# Patient Record
Sex: Female | Born: 1975 | Race: Black or African American | Hispanic: No | Marital: Married | Smoking: Current every day smoker
Health system: Southern US, Community
[De-identification: ages and names within clinical notes are randomized; demographics above are authoritative.]

## PROBLEM LIST (undated history)

## (undated) ENCOUNTER — Inpatient Hospital Stay (HOSPITAL_COMMUNITY): Payer: Self-pay

## (undated) DIAGNOSIS — K589 Irritable bowel syndrome without diarrhea: Secondary | ICD-10-CM

## (undated) DIAGNOSIS — D649 Anemia, unspecified: Secondary | ICD-10-CM

## (undated) DIAGNOSIS — I10 Essential (primary) hypertension: Secondary | ICD-10-CM

## (undated) DIAGNOSIS — E0590 Thyrotoxicosis, unspecified without thyrotoxic crisis or storm: Secondary | ICD-10-CM

## (undated) HISTORY — PX: TONSILLECTOMY: SUR1361

## (undated) HISTORY — PX: TUBAL LIGATION: SHX77

---

## 1997-12-01 ENCOUNTER — Inpatient Hospital Stay (HOSPITAL_COMMUNITY): Admission: AD | Admit: 1997-12-01 | Discharge: 1997-12-01 | Payer: Self-pay | Admitting: Obstetrics

## 1998-02-28 ENCOUNTER — Emergency Department (HOSPITAL_COMMUNITY): Admission: EM | Admit: 1998-02-28 | Discharge: 1998-02-28 | Payer: Self-pay | Admitting: Emergency Medicine

## 1998-05-31 ENCOUNTER — Emergency Department (HOSPITAL_COMMUNITY): Admission: EM | Admit: 1998-05-31 | Discharge: 1998-05-31 | Payer: Self-pay

## 1999-01-02 ENCOUNTER — Emergency Department (HOSPITAL_COMMUNITY): Admission: EM | Admit: 1999-01-02 | Discharge: 1999-01-02 | Payer: Self-pay | Admitting: Emergency Medicine

## 1999-03-10 ENCOUNTER — Inpatient Hospital Stay (HOSPITAL_COMMUNITY): Admission: AD | Admit: 1999-03-10 | Discharge: 1999-03-10 | Payer: Self-pay | Admitting: *Deleted

## 1999-03-16 ENCOUNTER — Ambulatory Visit (HOSPITAL_COMMUNITY): Admission: RE | Admit: 1999-03-16 | Discharge: 1999-03-16 | Payer: Self-pay | Admitting: *Deleted

## 1999-05-23 ENCOUNTER — Emergency Department (HOSPITAL_COMMUNITY): Admission: EM | Admit: 1999-05-23 | Discharge: 1999-05-23 | Payer: Self-pay | Admitting: *Deleted

## 1999-08-29 ENCOUNTER — Inpatient Hospital Stay (HOSPITAL_COMMUNITY): Admission: AD | Admit: 1999-08-29 | Discharge: 1999-08-29 | Payer: Self-pay | Admitting: Obstetrics

## 1999-09-18 ENCOUNTER — Inpatient Hospital Stay (HOSPITAL_COMMUNITY): Admission: AD | Admit: 1999-09-18 | Discharge: 1999-09-18 | Payer: Self-pay | Admitting: Obstetrics

## 1999-09-21 ENCOUNTER — Encounter (HOSPITAL_COMMUNITY): Admission: AD | Admit: 1999-09-21 | Discharge: 1999-09-22 | Payer: Self-pay | Admitting: Obstetrics

## 1999-09-22 ENCOUNTER — Inpatient Hospital Stay (HOSPITAL_COMMUNITY): Admission: AD | Admit: 1999-09-22 | Discharge: 1999-09-25 | Payer: Self-pay | Admitting: Obstetrics

## 1999-09-22 ENCOUNTER — Encounter (INDEPENDENT_AMBULATORY_CARE_PROVIDER_SITE_OTHER): Payer: Self-pay

## 1999-10-09 ENCOUNTER — Inpatient Hospital Stay (HOSPITAL_COMMUNITY): Admission: AD | Admit: 1999-10-09 | Discharge: 1999-10-09 | Payer: Self-pay | Admitting: *Deleted

## 1999-12-29 ENCOUNTER — Emergency Department (HOSPITAL_COMMUNITY): Admission: EM | Admit: 1999-12-29 | Discharge: 1999-12-29 | Payer: Self-pay | Admitting: Emergency Medicine

## 2000-05-26 ENCOUNTER — Inpatient Hospital Stay (HOSPITAL_COMMUNITY): Admission: AD | Admit: 2000-05-26 | Discharge: 2000-05-26 | Payer: Self-pay | Admitting: Obstetrics

## 2000-12-28 ENCOUNTER — Inpatient Hospital Stay (HOSPITAL_COMMUNITY): Admission: AD | Admit: 2000-12-28 | Discharge: 2000-12-28 | Payer: Self-pay | Admitting: *Deleted

## 2002-11-12 ENCOUNTER — Inpatient Hospital Stay (HOSPITAL_COMMUNITY): Admission: AD | Admit: 2002-11-12 | Discharge: 2002-11-12 | Payer: Self-pay | Admitting: Family Medicine

## 2003-05-19 ENCOUNTER — Emergency Department (HOSPITAL_COMMUNITY): Admission: EM | Admit: 2003-05-19 | Discharge: 2003-05-19 | Payer: Self-pay | Admitting: Emergency Medicine

## 2003-06-15 ENCOUNTER — Emergency Department (HOSPITAL_COMMUNITY): Admission: EM | Admit: 2003-06-15 | Discharge: 2003-06-15 | Payer: Self-pay | Admitting: Emergency Medicine

## 2003-07-23 ENCOUNTER — Emergency Department (HOSPITAL_COMMUNITY): Admission: EM | Admit: 2003-07-23 | Discharge: 2003-07-23 | Payer: Self-pay | Admitting: Emergency Medicine

## 2003-08-10 ENCOUNTER — Encounter: Admission: RE | Admit: 2003-08-10 | Discharge: 2003-08-10 | Payer: Self-pay | Admitting: Family Medicine

## 2003-08-20 ENCOUNTER — Emergency Department (HOSPITAL_COMMUNITY): Admission: EM | Admit: 2003-08-20 | Discharge: 2003-08-20 | Payer: Self-pay | Admitting: Emergency Medicine

## 2004-02-09 ENCOUNTER — Other Ambulatory Visit: Admission: RE | Admit: 2004-02-09 | Discharge: 2004-02-09 | Payer: Self-pay | Admitting: Family Medicine

## 2004-05-03 ENCOUNTER — Ambulatory Visit: Payer: Self-pay | Admitting: Family Medicine

## 2004-07-25 ENCOUNTER — Ambulatory Visit: Payer: Self-pay | Admitting: Family Medicine

## 2004-08-29 ENCOUNTER — Ambulatory Visit: Payer: Self-pay | Admitting: Family Medicine

## 2004-10-11 ENCOUNTER — Ambulatory Visit: Payer: Self-pay | Admitting: Family Medicine

## 2004-11-04 ENCOUNTER — Emergency Department (HOSPITAL_COMMUNITY): Admission: EM | Admit: 2004-11-04 | Discharge: 2004-11-05 | Payer: Self-pay | Admitting: Emergency Medicine

## 2005-01-04 ENCOUNTER — Ambulatory Visit: Payer: Self-pay | Admitting: Family Medicine

## 2005-03-22 ENCOUNTER — Ambulatory Visit: Payer: Self-pay | Admitting: Family Medicine

## 2005-03-29 ENCOUNTER — Other Ambulatory Visit: Admission: RE | Admit: 2005-03-29 | Discharge: 2005-03-29 | Payer: Self-pay | Admitting: Family Medicine

## 2005-03-29 ENCOUNTER — Encounter: Payer: Self-pay | Admitting: Family Medicine

## 2005-03-29 ENCOUNTER — Ambulatory Visit: Payer: Self-pay | Admitting: Family Medicine

## 2005-06-21 ENCOUNTER — Ambulatory Visit: Payer: Self-pay | Admitting: Family Medicine

## 2005-08-30 ENCOUNTER — Emergency Department (HOSPITAL_COMMUNITY): Admission: EM | Admit: 2005-08-30 | Discharge: 2005-08-30 | Payer: Self-pay | Admitting: Emergency Medicine

## 2005-09-12 ENCOUNTER — Ambulatory Visit: Payer: Self-pay | Admitting: Family Medicine

## 2005-11-30 ENCOUNTER — Ambulatory Visit: Payer: Self-pay | Admitting: Family Medicine

## 2006-01-17 ENCOUNTER — Emergency Department (HOSPITAL_COMMUNITY): Admission: EM | Admit: 2006-01-17 | Discharge: 2006-01-17 | Payer: Self-pay | Admitting: Emergency Medicine

## 2006-02-25 ENCOUNTER — Ambulatory Visit: Payer: Self-pay | Admitting: Family Medicine

## 2006-03-29 ENCOUNTER — Emergency Department (HOSPITAL_COMMUNITY): Admission: EM | Admit: 2006-03-29 | Discharge: 2006-03-29 | Payer: Self-pay | Admitting: Emergency Medicine

## 2006-06-10 ENCOUNTER — Ambulatory Visit: Payer: Self-pay | Admitting: Family Medicine

## 2006-06-11 ENCOUNTER — Ambulatory Visit: Payer: Self-pay | Admitting: Family Medicine

## 2006-07-06 ENCOUNTER — Emergency Department (HOSPITAL_COMMUNITY): Admission: EM | Admit: 2006-07-06 | Discharge: 2006-07-07 | Payer: Self-pay | Admitting: Emergency Medicine

## 2006-07-11 ENCOUNTER — Encounter (INDEPENDENT_AMBULATORY_CARE_PROVIDER_SITE_OTHER): Payer: Self-pay | Admitting: Family Medicine

## 2006-07-11 ENCOUNTER — Ambulatory Visit: Payer: Self-pay | Admitting: Family Medicine

## 2006-08-06 ENCOUNTER — Ambulatory Visit: Payer: Self-pay | Admitting: Family Medicine

## 2006-08-08 ENCOUNTER — Emergency Department (HOSPITAL_COMMUNITY): Admission: EM | Admit: 2006-08-08 | Discharge: 2006-08-08 | Payer: Self-pay | Admitting: Emergency Medicine

## 2006-10-03 ENCOUNTER — Ambulatory Visit: Payer: Self-pay | Admitting: Family Medicine

## 2007-02-26 ENCOUNTER — Emergency Department (HOSPITAL_COMMUNITY): Admission: EM | Admit: 2007-02-26 | Discharge: 2007-02-26 | Payer: Self-pay | Admitting: Emergency Medicine

## 2008-03-05 ENCOUNTER — Emergency Department (HOSPITAL_COMMUNITY): Admission: EM | Admit: 2008-03-05 | Discharge: 2008-03-05 | Payer: Self-pay | Admitting: Emergency Medicine

## 2008-06-27 ENCOUNTER — Emergency Department (HOSPITAL_COMMUNITY): Admission: EM | Admit: 2008-06-27 | Discharge: 2008-06-28 | Payer: Self-pay | Admitting: Emergency Medicine

## 2008-07-08 ENCOUNTER — Ambulatory Visit: Payer: Self-pay | Admitting: Family Medicine

## 2008-07-08 DIAGNOSIS — N760 Acute vaginitis: Secondary | ICD-10-CM

## 2008-07-27 ENCOUNTER — Encounter: Admission: RE | Admit: 2008-07-27 | Discharge: 2008-07-27 | Payer: Self-pay | Admitting: Obstetrics

## 2009-05-04 ENCOUNTER — Emergency Department (HOSPITAL_COMMUNITY): Admission: EM | Admit: 2009-05-04 | Discharge: 2009-05-04 | Payer: Self-pay | Admitting: Emergency Medicine

## 2009-10-18 ENCOUNTER — Ambulatory Visit: Payer: Self-pay | Admitting: Family Medicine

## 2009-10-25 ENCOUNTER — Ambulatory Visit: Payer: Self-pay | Admitting: Family Medicine

## 2009-10-25 ENCOUNTER — Other Ambulatory Visit: Admission: RE | Admit: 2009-10-25 | Discharge: 2009-10-25 | Payer: Self-pay | Admitting: Family Medicine

## 2009-10-25 LAB — CONVERTED CEMR LAB
ALT: 13 units/L (ref 0–35)
Albumin: 3.4 g/dL — ABNORMAL LOW (ref 3.5–5.2)
Alkaline Phosphatase: 70 units/L (ref 39–117)
Basophils Absolute: 0 10*3/uL (ref 0.0–0.1)
Basophils Relative: 0.5 % (ref 0.0–3.0)
Bilirubin Urine: NEGATIVE
Bilirubin, Direct: 0.1 mg/dL (ref 0.0–0.3)
Creatinine, Ser: 0.8 mg/dL (ref 0.4–1.2)
Eosinophils Relative: 2.2 % (ref 0.0–5.0)
HCT: 34.1 % — ABNORMAL LOW (ref 36.0–46.0)
Hemoglobin: 11.4 g/dL — ABNORMAL LOW (ref 12.0–15.0)
Ketones, urine, test strip: NEGATIVE
LDL Cholesterol: 37 mg/dL (ref 0–99)
Monocytes Absolute: 0.6 10*3/uL (ref 0.1–1.0)
Monocytes Relative: 8.8 % (ref 3.0–12.0)
Neutro Abs: 4 10*3/uL (ref 1.4–7.7)
Nitrite: NEGATIVE
Potassium: 3.9 meq/L (ref 3.5–5.1)
RBC: 4 M/uL (ref 3.87–5.11)
Sodium: 146 meq/L — ABNORMAL HIGH (ref 135–145)
Specific Gravity, Urine: 1.02
TSH: 1.09 microintl units/mL (ref 0.35–5.50)
Total Bilirubin: 0.5 mg/dL (ref 0.3–1.2)
Total CHOL/HDL Ratio: 2
Total Protein: 7.4 g/dL (ref 6.0–8.3)
Triglycerides: 78 mg/dL (ref 0.0–149.0)
VLDL: 15.6 mg/dL (ref 0.0–40.0)
WBC Urine, dipstick: NEGATIVE
WBC: 6.6 10*3/uL (ref 4.5–10.5)

## 2009-12-25 ENCOUNTER — Emergency Department (HOSPITAL_COMMUNITY): Admission: EM | Admit: 2009-12-25 | Discharge: 2009-12-25 | Payer: Self-pay | Admitting: Emergency Medicine

## 2010-01-09 ENCOUNTER — Ambulatory Visit: Payer: Self-pay | Admitting: Family Medicine

## 2010-01-09 DIAGNOSIS — S61209A Unspecified open wound of unspecified finger without damage to nail, initial encounter: Secondary | ICD-10-CM

## 2010-01-24 ENCOUNTER — Ambulatory Visit: Payer: Self-pay | Admitting: Family Medicine

## 2010-08-01 NOTE — Assessment & Plan Note (Signed)
Summary: suture removal/dm   Vital Signs:  Patient profile:   35 year old female Weight:      207 pounds BP sitting:   120 / 80  (left arm) Cuff size:   large  Vitals Entered By: Raechel Ache, RN (January 09, 2010 4:04 PM) CC: Needs sutures out R thumb. Went to [facility] ER 6/26, DT given.   History of Present Illness: On 12-25-09 while washing dishes at home, a glass broke and she got a laceration on the right thumb. She went to the ER, and had 8 sutures placed. She had a tD then also. She has been applying Neosporin to it daily. It is still a little sore, but she has been using it normally.   Allergies: 1)  ! Adult Aspirin Low Strength (Aspirin)  Past History:  Past Medical History: Reviewed history from 07/08/2008 and no changes required. Unremarkable  Past Surgical History: Reviewed history from 07/08/2008 and no changes required. Caesarean section  Review of Systems  The patient denies anorexia, fever, weight loss, weight gain, vision loss, decreased hearing, hoarseness, chest pain, syncope, dyspnea on exertion, peripheral edema, prolonged cough, headaches, hemoptysis, abdominal pain, melena, hematochezia, severe indigestion/heartburn, hematuria, incontinence, genital sores, muscle weakness, suspicious skin lesions, transient blindness, difficulty walking, depression, unusual weight change, abnormal bleeding, enlarged lymph nodes, angioedema, breast masses, and testicular masses.    Physical Exam  General:  Well-developed,well-nourished,in no acute distress; alert,appropriate and cooperative throughout examination Skin:  the extensor surface of the right thumb MCP area has a healing laceration. The wound is clean.    Impression & Recommendations:  Problem # 1:  LACERATION, FINGER (ICD-883.0)  Orders: Suture Removal by Non-Operative MD ([id number])  Complete Medication List: 1)  Ferrous Sulfate 300 (60 Fe) Mg/56ml Syrp (Ferrous sulfate) .Marland Kitchen.. 1 once daily as needed  Patient  Instructions: 1)  All sutures were removed.  2)  Please schedule a follow-up appointment as needed .    Immunization History:  Tetanus/Td Immunization History:    Tetanus/Td:  td (12/25/2009)

## 2010-08-01 NOTE — Assessment & Plan Note (Signed)
Summary: depo inj/njr  Nurse Visit   Allergies: 1)  ! Adult Aspirin Low Strength (Aspirin) Laboratory Results   Urine Tests  Date/Time Received: January 24, 2010 3:04 PM  Date/Time Reported: January 24, 2010 3:04 PM     Urine HCG: negative Comments: Wynona Canes, CMA  January 24, 2010 3:04 PM      Medication Administration  Injection # 1:    Medication: Depo-Provera 150mg     Diagnosis: CONTRACEPTIVE MANAGEMENT (ICD-V25.09)    Route: IM    Site: L deltoid    Exp Date: 03/14    Lot #: [id number]    Mfr: [email or web address]    Patient tolerated injection without complications    Given by: Raechel Ache, RN (January 24, 2010 3:25 PM)  Orders Added: 1)  Urine Pregnancy Test  [81025] 2)  Depo-Provera 150mg  [J1055] 3)  Admin of Therapeutic Inj  intramuscular or subcutaneous [96372]  Laboratory Results   Urine Tests      Urine HCG: negative Comments: Wynona Canes, CMA  January 24, 2010 3:04 PM

## 2010-08-01 NOTE — Assessment & Plan Note (Signed)
Summary: cpx/pap//njr   Vital Signs:  Patient profile:   35 year old female Height:      63.75 inches Weight:      205 pounds BMI:     35.59 BP sitting:   126 / 78  (left arm) Cuff size:   regular  Vitals Entered By: Raechel Ache, RN (October 25, 2009 2:48 PM) CC: CPX, labs done. LMP 10/19/08, wants to talk about depo shots.   History of Present Illness: 35 yr old female for a cpx. She feels fine and has no concerns. She would like to get back on Depoprovera shots however. She took them for years and did well, until she stopped about 2 years ago. her menses are regular.   Preventive Screening-Counseling & Management  Alcohol-Tobacco     Smoking Status: current  Allergies: 1)  ! Adult Aspirin Low Strength (Aspirin)  Past History:  Past Medical History: Reviewed history from 07/08/2008 and no changes required. Unremarkable  Past Surgical History: Reviewed history from 07/08/2008 and no changes required. Caesarean section  Family History: Reviewed history and no changes required. Family History Diabetes 1st degree relative  Social History: Reviewed history and no changes required. Married Current Smoker Alcohol use-yes Smoking Status:  current  Review of Systems  The patient denies anorexia, fever, weight loss, weight gain, vision loss, decreased hearing, hoarseness, chest pain, syncope, dyspnea on exertion, peripheral edema, prolonged cough, headaches, hemoptysis, abdominal pain, melena, hematochezia, severe indigestion/heartburn, hematuria, incontinence, genital sores, muscle weakness, suspicious skin lesions, transient blindness, difficulty walking, depression, unusual weight change, abnormal bleeding, enlarged lymph nodes, angioedema, breast masses, and testicular masses.    Physical Exam  General:  overweight-appearing.   Head:  Normocephalic and atraumatic without obvious abnormalities. No apparent alopecia or balding. Eyes:  No corneal or conjunctival  inflammation noted. EOMI. Perrla. Funduscopic exam benign, without hemorrhages, exudates or papilledema. Vision grossly normal. Ears:  External ear exam shows no significant lesions or deformities.  Otoscopic examination reveals clear canals, tympanic membranes are intact bilaterally without bulging, retraction, inflammation or discharge. Hearing is grossly normal bilaterally. Nose:  External nasal examination shows no deformity or inflammation. Nasal mucosa are pink and moist without lesions or exudates. Mouth:  Oral mucosa and oropharynx without lesions or exudates.  Teeth in good repair. Neck:  No deformities, masses, or tenderness noted. Chest Wall:  No deformities, masses, or tenderness noted. Breasts:  No mass, nodules, thickening, tenderness, bulging, retraction, inflamation, nipple discharge or skin changes noted.   Lungs:  Normal respiratory effort, chest expands symmetrically. Lungs are clear to auscultation, no crackles or wheezes. Heart:  Normal rate and regular rhythm. S1 and S2 normal without gallop, murmur, click, rub or other extra sounds. Abdomen:  Bowel sounds positive,abdomen soft and non-tender without masses, organomegaly or hernias noted. Genitalia:  Pelvic Exam:        External: normal female genitalia without lesions or masses        Vagina: normal without lesions or masses        Cervix: normal without lesions or masses        Adnexa: normal bimanual exam without masses or fullness        Uterus: normal by palpation        Pap smear: performed Msk:  No deformity or scoliosis noted of thoracic or lumbar spine.   Pulses:  R and L carotid,radial,femoral,dorsalis pedis and posterior tibial pulses are full and equal bilaterally Extremities:  No clubbing, cyanosis, edema, or deformity noted with  normal full range of motion of all joints.   Neurologic:  No cranial nerve deficits noted. Station and gait are normal. Plantar reflexes are down-going bilaterally. DTRs are symmetrical  throughout. Sensory, motor and coordinative functions appear intact. Skin:  Intact without suspicious lesions or rashes Cervical Nodes:  No lymphadenopathy noted Axillary Nodes:  No palpable lymphadenopathy Inguinal Nodes:  No significant adenopathy Psych:  Cognition and judgment appear intact. Alert and cooperative with normal attention span and concentration. No apparent delusions, illusions, hallucinations   Impression & Recommendations:  Problem # 1:  HEALTH MAINTENANCE EXAM (ICD-V70.0)  Orders: Urine Pregnancy Test  (30865)  Complete Medication List: 1)  Ferrous Sulfate 300 (60 Fe) Mg/3ml Syrp (Ferrous sulfate) .Marland Kitchen.. 1 once daily as needed  Other Orders: Depo-Provera 150mg  (H8469) Admin of Therapeutic Inj  intramuscular or subcutaneous (62952)  Patient Instructions: 1)  given a Depoprovera shot today. 2)  It is important that you exercise reguarly at least 20 minutes 5 times a week. If you develop chest pain, have severe difficulty breathing, or feel very tired, stop exercising immediately and seek medical attention.  3)  You need to lose weight. Consider a lower calorie diet and regular exercise.   Laboratory Results   Urine Tests  Date/Time Recieved: October 25, 2009 3:52 PM  Date/Time Reported: October 25, 2009 3:52 PM     Urine HCG: negative Comments: Wynona Canes, CMA  October 25, 2009 3:52 PM      Medication Administration  Injection # 1:    Medication: Depo-Provera 150mg     Diagnosis: CONTRACEPTIVE MANAGEMENT (ICD-V25.09)    Route: IM    Site: L deltoid    Exp Date: 05/13    Lot #: [id number]    Mfr: [email or web address]    Patient tolerated injection without complications    Given by: Raechel Ache, RN (October 25, 2009 3:59 PM)  Orders Added: 1)  Est. Patient 18-39 years [99395] 2)  Urine Pregnancy Test  [81025] 3)  Depo-Provera 150mg  [J1055] 4)  Admin of Therapeutic Inj  intramuscular or subcutaneous [40102]

## 2010-09-03 ENCOUNTER — Emergency Department (HOSPITAL_COMMUNITY)
Admission: EM | Admit: 2010-09-03 | Discharge: 2010-09-03 | Disposition: A | Discharge status: Home / Self Care | Payer: Worker's Compensation | Attending: Emergency Medicine | Admitting: Emergency Medicine

## 2010-09-03 ENCOUNTER — Emergency Department (HOSPITAL_COMMUNITY): Payer: Worker's Compensation

## 2010-09-03 DIAGNOSIS — M25539 Pain in unspecified wrist: Secondary | ICD-10-CM | POA: Insufficient documentation

## 2010-09-03 DIAGNOSIS — IMO0002 Reserved for concepts with insufficient information to code with codable children: Secondary | ICD-10-CM | POA: Insufficient documentation

## 2010-11-17 NOTE — Op Note (Signed)
[facility]  Patient:    Shelby Harris, Shelby Harris                      MRN: [id number] Proc. Date: 09/23/99 Adm. Date:  [id number] Disc. Date: [id number] Attending:  Venita Sheffield                           Operative Report  PREOPERATIVE DIAGNOSIS:       Failure to progress in labor.  POSTOPERATIVE DIAGNOSIS:  OPERATION:  SURGEON:                      Kathreen Cosier, M.D.  ANESTHESIA:                   Epidural.  DESCRIPTION OF PROCEDURE:     Patient was placed on the operating table in supine position, abdomen prepped and draped, bladder emptied with a Foley catheter. Transverse suprapubic incision was made and carried down to the rectus fascia, fascia cleaned and incised the length of the incision, rectus muscles retracted  laterally, peritoneum incised longitudinally.  Transverse incision was made in he visceroperitoneum above the bladder and the bladder mobilized inferiorly. Transverse lower uterine incision was made; the fluid was meconium-stained. The patient was delivered from the OP position of a female, Apgar 9/9, weighing 7 pounds. The nasopharynx and stomach were DeLeeed prior to delivery of the shoulders. The team was in attendance.  The placenta was anterior, removed manually and sent to pathology.  Uterine cavity was cleaned with dry laps.  The uterine incision was  closed with one stitch of continuous interlocking suture of #1 chromic; hemostasis was satisfactory.  Bladder flap was reattached with 2-0 chromic.  Uterus was well-contracted.  Tubes and ovaries were normal.  Abdomen was closed in layers - peritoneum with continuous suture of 0 chromic, fascia with continuous suture of 0 Dexon and the skin closed with a subcuticular suture of 3-0 plain.  Patient tolerated procedure well and taken to recovery room in good condition.DD: 09/23/99 TD:  09/25/99 Job: [id number]

## 2010-11-17 NOTE — Discharge Summary (Signed)
[facility]  Patient:    Shelby Harris, Shelby Harris                      MRN: [id number] Adm. Date:  [id number] Disc. Date: [id number] Attending:  Venita Sheffield                           Discharge Summary  HISTORY OF PRESENT ILLNESS:   The patient is a 35 year old primigravida, [facility] September 20, 1999, who was admitted in labor.  The cervix was 4 cm, 100%, with vertex -2.  HOSPITAL COURSE:              On admission, amniotomy was performed and the fluid was meconium stained.  IUPC and scalp lead were applied.  Her diastolic blood pressures also ranged between 90 and 105.  Urine protein was negative.  PIH labs were negative except for uric acid of 7.1.  She was started on magnesium sulfate 4 gram load and 2 grams per hour.  The patient was started on Pitocin for hypotonic dysfunction and she progressed to 7 cm.  At 7 p.m. on March 23, she was 8 cm with a vertex molded to -1 station and by 12:45 a.m. on March 24, there were no changes in the pelvic findings at that time with adequate labor.  It was decided that she would be delivered by cesarean section for failure to progress.  She underwent  low transverse cesarean section delivering a 7 pound female, Apgars 9 and 9, from the OP position on March 24.  The fluid was meconium stained.  Cord pH was also done. Postoperatively, the patient did well.  She was discharged home on the third postoperative day.  Her hemoglobin on admission was 9.9 and postoperative 8.8. Her repeat PIH labs were all normal and her blood pressure was normal and the magnesium sulfate was discontinued the day after her cesarean section.  She was discharged home on Tylox one q.4h. p.r.n. to see me in six weeks.  DISCHARGE DIAGNOSES:          1. Status post preeclampsia, mild.                               2. Status post cesarean section for failure to                                  progress in labor. DD:  09/25/99 TD:   09/25/99 Job: [id number]

## 2010-11-17 NOTE — H&P (Signed)
[facility]  Patient:    Shelby Harris, Shelby Harris                      MRN: [id number] Adm. Date:  [id number] Attending:  Venita Sheffield                         History and Physical  HISTORY OF PRESENT ILLNESS:   Patient is a 34 year old primigravida, [facility] September 20, 1999, who was in admitted in labor, 4 cm, 100%, with a vertex -2. Amniotomy was performed and the fluid was meconium stained.  Her diastolic was lso between 90 and 105 and her urine protein was negative.  Uric acid 7.1.  She was  started on magnesium sulfate 4 g loading, then 2 g/hr.  During the course of labor, her blood pressures remained normal.  Patient received Pitocin stimulation during the day because of hypotonic dysfunction, for a total of up to 4 mU/min of Pitocin; however, this had to be stopped and restarted from time to time because of hyperstimulation.  At 7 p.m. on September 22, 1999, she was 8 cm and molded to a -1  station, contracting every two to three minutes.  At 12:45 a.m., on September 23, 1999, the pelvic findings were unchanged except for their being more nd more latent.  It was decided she would be delivered by a C-section because of failure to progress in labor.  PHYSICAL EXAMINATION:  GENERAL:                      Physical exam revealed a well-developed female in  labor.  HEENT:                        Negative.  LUNGS:                        Clear.  HEART:                        Regular rhythm.  No murmurs.  No gallops.  ABDOMEN:                      Term-size uterus.  Fetal heart 140.  PELVIC:                       As described above.  EXTREMITIES:                  Edema 2+. DD:  09/23/99 TD:  09/23/99 Job: [id number]

## 2011-03-10 ENCOUNTER — Inpatient Hospital Stay (HOSPITAL_COMMUNITY): Payer: Medicaid Other

## 2011-03-10 ENCOUNTER — Encounter (HOSPITAL_COMMUNITY): Payer: Self-pay

## 2011-03-10 ENCOUNTER — Inpatient Hospital Stay (HOSPITAL_COMMUNITY)
Admission: AD | Admit: 2011-03-10 | Discharge: 2011-03-10 | Disposition: A | Discharge status: Home / Self Care | Payer: Medicaid Other | Source: Ambulatory Visit | Attending: Obstetrics | Admitting: Obstetrics

## 2011-03-10 DIAGNOSIS — R109 Unspecified abdominal pain: Secondary | ICD-10-CM | POA: Insufficient documentation

## 2011-03-10 DIAGNOSIS — A499 Bacterial infection, unspecified: Secondary | ICD-10-CM | POA: Insufficient documentation

## 2011-03-10 DIAGNOSIS — B9689 Other specified bacterial agents as the cause of diseases classified elsewhere: Secondary | ICD-10-CM | POA: Insufficient documentation

## 2011-03-10 DIAGNOSIS — N760 Acute vaginitis: Secondary | ICD-10-CM | POA: Insufficient documentation

## 2011-03-10 DIAGNOSIS — O2390 Unspecified genitourinary tract infection in pregnancy, unspecified trimester: Secondary | ICD-10-CM | POA: Insufficient documentation

## 2011-03-10 DIAGNOSIS — Z3490 Encounter for supervision of normal pregnancy, unspecified, unspecified trimester: Secondary | ICD-10-CM

## 2011-03-10 HISTORY — DX: Irritable bowel syndrome, unspecified: K58.9

## 2011-03-10 LAB — ABO/RH: ABO/RH(D): O POS

## 2011-03-10 LAB — CBC
MCH: 27.3 pg (ref 26.0–34.0)
MCHC: 32.7 g/dL (ref 30.0–36.0)
MCV: 83.5 fL (ref 78.0–100.0)
Platelets: 369 10*3/uL (ref 150–400)
RBC: 3.99 MIL/uL (ref 3.87–5.11)
RDW: 13.5 % (ref 11.5–15.5)
WBC: 8.2 10*3/uL (ref 4.0–10.5)

## 2011-03-10 LAB — URINALYSIS, ROUTINE W REFLEX MICROSCOPIC
Bilirubin Urine: NEGATIVE
Ketones, ur: NEGATIVE mg/dL
Leukocytes, UA: NEGATIVE
Nitrite: NEGATIVE
Urobilinogen, UA: 1 mg/dL (ref 0.0–1.0)

## 2011-03-10 LAB — POCT PREGNANCY, URINE: Preg Test, Ur: POSITIVE

## 2011-03-10 LAB — URINE MICROSCOPIC-ADD ON

## 2011-03-10 LAB — WET PREP, GENITAL: WBC, Wet Prep HPF POC: NONE SEEN

## 2011-03-10 LAB — HCG, QUANTITATIVE, PREGNANCY: hCG, Beta Chain, Quant, S: 67302 m[IU]/mL — ABNORMAL HIGH (ref <5)

## 2011-03-10 MED ORDER — PRENATAL VITAMINS (DIS) PO TABS: 1.0000 | ORAL_TABLET | Freq: Every day | ORAL | Status: DC

## 2011-03-10 MED ORDER — METRONIDAZOLE 500 MG PO TABS: 500.0000 mg | ORAL_TABLET | Freq: Two times a day (BID) | ORAL | Status: AC

## 2011-03-10 NOTE — Progress Notes (Signed)
G1P1. LMP 7/5/512. May be pregnant. Abd cramping since yesterday. No bleeding or d/c

## 2011-03-10 NOTE — ED Provider Notes (Signed)
Chief Complaint:  Abdominal Pain   Shelby Harris is  35 y.o.  G2P1001 [redacted]w[redacted]d Patient's last menstrual period was 01/04/2011.Marland Kitchen  Her pregnancy status is positive.  She presents complaining of Abdominal Pain . Onset is described as gradual and has been present for  2 days. Upper and lower abd pain. States + UPT x 2 at home yesterday. Denies dysuria, d/c, and vag bleeding  Obstetrical/Gynecological History: OB History    Grav Para Term Preterm Abortions TAB SAB Ect Mult Living   2 1 1  0 0 0 0 0 0 1      Past Medical History: Past Medical History  Diagnosis Date  . IBS (irritable bowel syndrome)     Past Surgical History: Past Surgical History  Procedure Date  . Cesarean section     Family History: No family history on file.  Social History: History  Substance Use Topics  . Smoking status: Current Everyday Smoker  . Smokeless tobacco: Not on file  . Alcohol Use: No    Allergies:  Allergies  Allergen Reactions  . Aspirin Other (See Comments)    REACTION: since childhood--does not remember    Prescriptions prior to admission  Medication Sig Dispense Refill  . FOLIC ACID PO Take 1 tablet by mouth daily.        Marland Kitchen ibuprofen (ADVIL,MOTRIN) 200 MG tablet Take 800 mg by mouth daily as needed. For pain        . Multiple Vitamins-Minerals (MULTI COMPLETE PO) Take 1 tablet by mouth daily.        . polyethylene glycol powder (MIRALAX) powder Take 17 g by mouth daily.        . ferrous sulfate 300 (60 FE) MG/5ML syrup Take 300 mg by mouth daily.          Review of Systems - History obtained from the patient General ROS: negative Gastrointestinal ROS: positive for - abdominal pain, constipation and gas/bloating Genito-Urinary ROS: no dysuria, trouble voiding, or hematuria  Physical Exam   Blood pressure 129/79, pulse 98, temperature 98.5 F (36.9 C), temperature source Oral, resp. rate 20, height 5\' 4"  (1.626 m), weight 91.445 kg (201 lb 9.6 oz), last menstrual period  01/04/2011.  General: General appearance - alert, well appearing, and in no distress and overweight Mental status - alert, oriented to person, place, and time, normal mood, behavior, speech, dress, motor activity, and thought processes, affect appropriate to mood Abdomen - soft, nontender, nondistended, no masses or organomegaly, obese Focused Gynecological Exam: VULVA: normal appearing vulva with no masses, tenderness or lesions, VAGINA: vaginal discharge - white and malodorous, CERVIX: normal appearing cervix without discharge or lesions, UTERUS: enlarged to 8 week's size, ADNEXA: normal adnexa in size, nontender and no masses  Labs: Recent Results (from the past 24 hour(s))  URINALYSIS, ROUTINE W REFLEX MICROSCOPIC   Collection Time   03/10/11  7:22 PM      Component Value Range   Color, Urine YELLOW  YELLOW    Appearance CLEAR  CLEAR    Specific Gravity, Urine 1.010  1.005 - 1.030    pH 6.0  5.0 - 8.0    Glucose, UA NEGATIVE  NEGATIVE (mg/dL)   Hgb urine dipstick TRACE (*) NEGATIVE    Bilirubin Urine NEGATIVE  NEGATIVE    Ketones, ur NEGATIVE  NEGATIVE (mg/dL)   Protein, ur NEGATIVE  NEGATIVE (mg/dL)   Urobilinogen, UA 1.0  0.0 - 1.0 (mg/dL)   Nitrite NEGATIVE  NEGATIVE    Leukocytes, UA  NEGATIVE  NEGATIVE   URINE MICROSCOPIC-ADD ON   Collection Time   03/10/11  7:22 PM      Component Value Range   Squamous Epithelial / LPF FEW (*) RARE    WBC, UA 0-2  <3 (WBC/hpf)   RBC / HPF 0-2  <3 (RBC/hpf)   Bacteria, UA FEW (*) RARE   POCT PREGNANCY, URINE   Collection Time   03/10/11  7:29 PM      Component Value Range   Preg Test, Ur POSITIVE    ABO/RH   Collection Time   03/10/11  8:20 PM      Component Value Range   ABO/RH(D) O POS    CBC   Collection Time   03/10/11  8:20 PM      Component Value Range   WBC 8.2  4.0 - 10.5 (K/uL)   RBC 3.99  3.87 - 5.11 (MIL/uL)   Hemoglobin 10.9 (*) 12.0 - 15.0 (g/dL)   HCT 16.1 (*) 09.6 - 46.0 (%)   MCV 83.5  78.0 - 100.0 (fL)   MCH 27.3   26.0 - 34.0 (pg)   MCHC 32.7  30.0 - 36.0 (g/dL)   RDW 04.5  40.9 - 81.1 (%)   Platelets 369  150 - 400 (K/uL)  WET PREP, GENITAL   Collection Time   03/10/11  8:30 PM      Component Value Range   Yeast, Wet Prep NONE SEEN  NONE SEEN    Trich, Wet Prep NONE SEEN  NONE SEEN    Clue Cells, Wet Prep FEW (*) NONE SEEN    WBC, Wet Prep HPF POC NONE SEEN  NONE SEEN    Imaging Studies:  Unable to appreciate viable IUP trans-abd at bedside, will obtain formal trans-vag scan in radiology  *RADIOLOGY REPORT*  Clinical Data: Pelvic pain.  OBSTETRIC <14 WK [place] AND TRANSVAGINAL OB US  Technique: Both transabdominal and transvaginal ultrasound  examinations were performed for complete evaluation of the  gestation as well as the maternal uterus, adnexal regions, and  pelvic cul-de-sac. Transvaginal technique was performed to assess  early pregnancy.  Comparison: Pelvic ultrasound performed 08/10/2003  Intrauterine gestational sac: Visualized/normal in shape.  Yolk sac: Yes  Embryo: Yes  Cardiac Activity: Yes  Heart Rate: 170 bpm  CRL: 30.8 mm 10 w 0 d [place] EDC: 10/06/2011  Maternal uterus/adnexae:  The uterus is otherwise unremarkable in appearance; no subchorionic  hemorrhage is seen.  The ovaries are unremarkable in appearance. The right ovary  measures 2.5 x 1.5 x 1.6 cm, with a likely physiologic focus of  increased echogenicity noted in the right ovary. The left ovary  measures 3.7 x 2.3 x 1.8 cm. No suspicious adnexal masses are  seen. Limited color Doppler evaluation demonstrates normal color  Doppler blood flow with respect to both ovaries.  No free fluid is seen within the pelvic cul-de-sac.  IMPRESSION:  Single live intrauterine pregnancy, with a crown-rump length of  30.8 mm, corresponding to a gestational age of [redacted] weeks 0 days.  This matches the gestational age of [redacted] weeks 2 days by LMP, and  reflects an estimated date of delivery of October 11, 2011.  Original Report  Authenticated By: Tonia Ghent, M.D.       Assessment: Viable IUP [redacted]w[redacted]d BV  Patient Active Problem List  Diagnoses  . VAGINITIS, BACTERIAL  . LACERATION, FINGER    Plan: Discharge home Rx Flagyl and PNV FU with Dr. Gaynell Face to begin [facility]  Talley Kreiser E. 03/10/2011,9:38 PM

## 2011-04-06 LAB — URINALYSIS, ROUTINE W REFLEX MICROSCOPIC
Ketones, ur: NEGATIVE mg/dL
Nitrite: NEGATIVE
Specific Gravity, Urine: 1.025 (ref 1.005–1.030)
Urobilinogen, UA: 2 mg/dL — ABNORMAL HIGH (ref 0.0–1.0)
pH: 6.5 (ref 5.0–8.0)

## 2011-04-06 LAB — WET PREP, GENITAL
Trich, Wet Prep: NONE SEEN
WBC, Wet Prep HPF POC: NONE SEEN
Yeast Wet Prep HPF POC: NONE SEEN

## 2011-04-06 LAB — DIFFERENTIAL
Eosinophils Absolute: 0.2 10*3/uL (ref 0.0–0.7)
Eosinophils Relative: 2 % (ref 0–5)
Lymphocytes Relative: 16 % (ref 12–46)
Lymphs Abs: 1.6 10*3/uL (ref 0.7–4.0)
Monocytes Absolute: 0.7 10*3/uL (ref 0.1–1.0)
Monocytes Relative: 7 % (ref 3–12)

## 2011-04-06 LAB — COMPREHENSIVE METABOLIC PANEL
ALT: 12 U/L (ref 0–35)
AST: 19 U/L (ref 0–37)
Albumin: 3.3 g/dL — ABNORMAL LOW (ref 3.5–5.2)
CO2: 26 mEq/L (ref 19–32)
Calcium: 9 mg/dL (ref 8.4–10.5)
GFR calc Af Amer: >60 mL/min (ref >60)
GFR calc non Af Amer: >60 mL/min (ref >60)
Sodium: 137 mEq/L (ref 135–145)

## 2011-04-06 LAB — LIPASE, BLOOD: Lipase: 22 U/L (ref 11–59)

## 2011-04-06 LAB — CBC
MCHC: 32.7 g/dL (ref 30.0–36.0)
Platelets: 319 10*3/uL (ref 150–400)
RBC: 4.18 MIL/uL (ref 3.87–5.11)
WBC: 9.7 10*3/uL (ref 4.0–10.5)

## 2011-04-06 LAB — RPR: RPR Ser Ql: NONREACTIVE

## 2011-05-04 LAB — RPR: RPR: NONREACTIVE

## 2011-05-04 LAB — ANTIBODY SCREEN: Antibody Screen: NEGATIVE

## 2011-07-03 NOTE — L&D Delivery Note (Signed)
Requested to attend elective repeat C/S at term gestation with no other risk factors reported.  At delivery infant in vertex presentation and was manually extracted with spontaneous lusty cries and active movement of all extremities. Given tactile stimulation with drying and bulb suction to naso/oropharynx yielding minimal clear fluid. No dysmorphic features. Voided x 1 while under radiant warmer.   Shown to parents and care transferred to [facility] RN's and to assigned pediatrician.   Dagoberto Ligas MD [facility]

## 2011-09-12 ENCOUNTER — Encounter (HOSPITAL_COMMUNITY): Payer: Self-pay | Admitting: Pharmacist

## 2011-09-17 ENCOUNTER — Encounter (HOSPITAL_COMMUNITY): Payer: Self-pay

## 2011-09-17 NOTE — Patient Instructions (Addendum)
   Your procedure is scheduled on: Wednesday April 3rd  Enter through the [employer] of [facility] at: 8am Pick up the phone at the desk and dial [phone] and inform us of your arrival.  Please call this number if you have any problems the morning of surgery: [phone]  Remember: Do not eat food after midnight: Tuesday Do not drink clear liquids after: midnight Tuesday Take these medicines the morning of surgery with a SIP OF WATER:none  Do not wear jewelry, make-up, or FINGER nail polish Do not wear lotions, powders, perfumes or deodorant. Do not shave 48 hours prior to surgery. Do not bring valuables to the hospital. Contacts, dentures or bridgework may not be worn into surgery.  Leave suitcase in the car. After Surgery it may be brought to your room. For patients being admitted to the hospital, checkout time is 11:00am the day of discharge.       Remember to use your hibiclens as instructed.Please shower with 1/2 bottle the evening before your surgery and the other 1/2 bottle the morning of surgery. Neck down avoiding private area.

## 2011-09-18 ENCOUNTER — Encounter (HOSPITAL_COMMUNITY)
Admission: RE | Admit: 2011-09-18 | Discharge: 2011-09-18 | Disposition: A | Discharge status: Home / Self Care | Payer: Medicaid Other | Source: Ambulatory Visit | Attending: Obstetrics | Admitting: Obstetrics

## 2011-09-18 ENCOUNTER — Encounter (HOSPITAL_COMMUNITY): Payer: Self-pay

## 2011-09-18 ENCOUNTER — Encounter (HOSPITAL_COMMUNITY): Payer: Self-pay | Admitting: *Deleted

## 2011-09-18 ENCOUNTER — Inpatient Hospital Stay (HOSPITAL_COMMUNITY)
Admission: AD | Admit: 2011-09-18 | Discharge: 2011-09-18 | Disposition: A | Discharge status: Home / Self Care | Payer: Medicaid Other | Source: Ambulatory Visit | Attending: Obstetrics | Admitting: Obstetrics

## 2011-09-18 DIAGNOSIS — Z01818 Encounter for other preprocedural examination: Secondary | ICD-10-CM | POA: Insufficient documentation

## 2011-09-18 DIAGNOSIS — O479 False labor, unspecified: Secondary | ICD-10-CM | POA: Insufficient documentation

## 2011-09-18 HISTORY — DX: Thyrotoxicosis, unspecified without thyrotoxic crisis or storm: E05.90

## 2011-09-18 HISTORY — DX: Anemia, unspecified: D64.9

## 2011-09-18 LAB — SURGICAL PCR SCREEN
MRSA, PCR: NEGATIVE
Staphylococcus aureus: NEGATIVE

## 2011-09-18 NOTE — Pre-Procedure Instructions (Addendum)
To MAU-pt in room 8-history taken and surgical pcr screen done. Vitals and Pain assessment done in MAU. No labs drawn at present time

## 2011-09-18 NOTE — Progress Notes (Signed)
See paper recording of FHR. Did not archive on OBIX.

## 2011-09-18 NOTE — MAU Note (Signed)
Pt reports she was here at her preop visit ansd nurse noticed she was probable having ctx told to her come and get it checked out. Pt reports having pelvic pressure all morning long and feeling ctx q 10-15 min. Denies SROM or bleeding and reports good fetal movement

## 2011-09-18 NOTE — Progress Notes (Signed)
Notified pt c/o ctx ealier none on monitor and pt stated she felt much better after laying down. Cervix closed and posterior. D/C pt and have her keep appointment in office today.

## 2011-09-18 NOTE — Pre-Procedure Instructions (Addendum)
Pt in admitting signing in, c/o uterine cramping, to MAU for evaluation. Pt has preop visit with Dr Gaynell Face at 2:45 pm-office notified pt in mau

## 2011-09-19 ENCOUNTER — Other Ambulatory Visit: Payer: Self-pay | Admitting: Obstetrics

## 2011-09-28 NOTE — H&P (Signed)
NAMESHARADA, [name]               ACCOUNT NO.:  [id number]  MEDICAL RECORD NO.:  [id number]  LOCATION:  [facility]                           FACILITY:  [facility]  PHYSICIAN:  Kathreen Cosier, M.D.DATE OF BIRTH:  1975/11/17  DATE OF ADMISSION:  09/18/2011 DATE OF DISCHARGE:  09/18/2011                             HISTORY & PHYSICAL   The patient is a 36 year old gravida 2, para 1-0-0-1, who had a C- section in the past and now desires C-section and tubal ligation.  Her prenatal care was uneventful.  PAST MEDICAL HISTORY:  Negative.  PAST SURGICAL HISTORY:  C-section x1.  SOCIAL HISTORY:  Denies smoking, drinking, alcohol use, or drug use.  SYSTEM REVIEW:  Negative.  PHYSICAL EXAMINATION:  GENERAL:  Revealed a well-developed female in no distress. HEENT:  Negative. BREASTS:  Negative. LUNGS:  Clear to P and A. HEART:  Regular rhythm.  No murmurs, no gallops. ABDOMEN:  Term.  Cervix long and closed. EXTREMITIES:  Negative.          ______________________________ Kathreen Cosier, M.D.     BAM/MEDQ  D:  09/27/2011  T:  09/28/2011  Job:  [id number]

## 2011-10-02 MED ORDER — CEFAZOLIN SODIUM-DEXTROSE 2-3 GM-% IV SOLR
2.0000 g | INTRAVENOUS | Status: AC
  Administered 2011-10-03 (×2): 1 g via INTRAVENOUS
  Filled 2011-10-02: qty 50

## 2011-10-03 ENCOUNTER — Inpatient Hospital Stay (HOSPITAL_COMMUNITY): Payer: Medicaid Other | Admitting: Anesthesiology

## 2011-10-03 ENCOUNTER — Encounter (HOSPITAL_COMMUNITY): Payer: Self-pay | Admitting: Anesthesiology

## 2011-10-03 ENCOUNTER — Encounter (HOSPITAL_COMMUNITY): Payer: Self-pay | Admitting: *Deleted

## 2011-10-03 ENCOUNTER — Encounter (HOSPITAL_COMMUNITY)
Admission: RE | Disposition: A | Discharge status: Home / Self Care | Payer: Self-pay | Source: Ambulatory Visit | Attending: Obstetrics

## 2011-10-03 ENCOUNTER — Inpatient Hospital Stay (HOSPITAL_COMMUNITY)
Admission: RE | Admit: 2011-10-03 | Discharge: 2011-10-06 | DRG: 766 | Disposition: A | Discharge status: Home / Self Care | Payer: Medicaid Other | Source: Ambulatory Visit | Attending: Obstetrics | Admitting: Obstetrics

## 2011-10-03 DIAGNOSIS — Z98891 History of uterine scar from previous surgery: Secondary | ICD-10-CM

## 2011-10-03 DIAGNOSIS — O34219 Maternal care for unspecified type scar from previous cesarean delivery: Principal | ICD-10-CM | POA: Diagnosis present

## 2011-10-03 DIAGNOSIS — Z01812 Encounter for preprocedural laboratory examination: Secondary | ICD-10-CM

## 2011-10-03 DIAGNOSIS — N760 Acute vaginitis: Secondary | ICD-10-CM

## 2011-10-03 DIAGNOSIS — Z01818 Encounter for other preprocedural examination: Secondary | ICD-10-CM

## 2011-10-03 DIAGNOSIS — S61209A Unspecified open wound of unspecified finger without damage to nail, initial encounter: Secondary | ICD-10-CM

## 2011-10-03 LAB — DIFFERENTIAL
Basophils Absolute: 0 10*3/uL (ref 0.0–0.1)
Basophils Relative: 0 % (ref 0–1)
Eosinophils Absolute: 0.1 10*3/uL (ref 0.0–0.7)
Eosinophils Relative: 1 % (ref 0–5)
Lymphocytes Relative: 14 % (ref 12–46)
Monocytes Absolute: 1 10*3/uL (ref 0.1–1.0)

## 2011-10-03 LAB — TYPE AND SCREEN

## 2011-10-03 LAB — CBC
HCT: 32.9 % — ABNORMAL LOW (ref 36.0–46.0)
MCH: 27.9 pg (ref 26.0–34.0)
MCHC: 31.9 g/dL (ref 30.0–36.0)
MCV: 87.5 fL (ref 78.0–100.0)
Platelets: 285 10*3/uL (ref 150–400)
RDW: 14.8 % (ref 11.5–15.5)
WBC: 9.6 10*3/uL (ref 4.0–10.5)

## 2011-10-03 LAB — RPR: RPR Ser Ql: NONREACTIVE

## 2011-10-03 SURGERY — Surgical Case
Anesthesia: Spinal | Laterality: Bilateral

## 2011-10-03 MED ORDER — ACETAMINOPHEN 10 MG/ML IV SOLN
1000.0000 mg | Freq: Four times a day (QID) | INTRAVENOUS | Status: AC | PRN
  Filled 2011-10-03: qty 100

## 2011-10-03 MED ORDER — CEFAZOLIN SODIUM 1-5 GM-% IV SOLN
INTRAVENOUS | Status: AC
  Filled 2011-10-03: qty 50

## 2011-10-03 MED ORDER — ONDANSETRON HCL 4 MG/2ML IJ SOLN: 4.0000 mg | INTRAMUSCULAR | Status: DC | PRN

## 2011-10-03 MED ORDER — SCOPOLAMINE 1 MG/3DAYS TD PT72
1.0000 | MEDICATED_PATCH | Freq: Once | TRANSDERMAL | Status: DC
  Administered 2011-10-03: 1.5 mg via TRANSDERMAL

## 2011-10-03 MED ORDER — SIMETHICONE 80 MG PO CHEW
80.0000 mg | CHEWABLE_TABLET | Freq: Three times a day (TID) | ORAL | Status: DC
  Administered 2011-10-03 – 2011-10-05 (×9): 80 mg via ORAL

## 2011-10-03 MED ORDER — PHENYLEPHRINE HCL 10 MG/ML IJ SOLN
INTRAMUSCULAR | Status: DC | PRN
  Administered 2011-10-03 (×2): 40 ug via INTRAVENOUS

## 2011-10-03 MED ORDER — SCOPOLAMINE 1 MG/3DAYS TD PT72
1.0000 | MEDICATED_PATCH | Freq: Once | TRANSDERMAL | Status: DC
  Filled 2011-10-03: qty 1

## 2011-10-03 MED ORDER — OXYTOCIN 10 UNIT/ML IJ SOLN
INTRAMUSCULAR | Status: AC
  Filled 2011-10-03: qty 2

## 2011-10-03 MED ORDER — FENTANYL CITRATE 0.05 MG/ML IJ SOLN
25.0000 ug | INTRAMUSCULAR | Status: DC | PRN
  Administered 2011-10-03: 50 ug via INTRAVENOUS

## 2011-10-03 MED ORDER — NALOXONE HCL 0.4 MG/ML IJ SOLN: 0.4000 mg | INTRAMUSCULAR | Status: DC | PRN

## 2011-10-03 MED ORDER — PHENYLEPHRINE 40 MCG/ML (10ML) SYRINGE FOR IV PUSH (FOR BLOOD PRESSURE SUPPORT)
PREFILLED_SYRINGE | INTRAVENOUS | Status: AC
  Filled 2011-10-03: qty 5

## 2011-10-03 MED ORDER — SODIUM CHLORIDE 0.9 % IJ SOLN: 3.0000 mL | INTRAMUSCULAR | Status: DC | PRN

## 2011-10-03 MED ORDER — PRENATAL MULTIVITAMIN CH
1.0000 | ORAL_TABLET | Freq: Every day | ORAL | Status: DC
  Administered 2011-10-04 – 2011-10-05 (×2): 1 via ORAL
  Filled 2011-10-03 (×2): qty 1

## 2011-10-03 MED ORDER — DIPHENHYDRAMINE HCL 25 MG PO CAPS: 25.0000 mg | ORAL_CAPSULE | Freq: Four times a day (QID) | ORAL | Status: DC | PRN

## 2011-10-03 MED ORDER — EPHEDRINE 5 MG/ML INJ
INTRAVENOUS | Status: AC
  Filled 2011-10-03: qty 10

## 2011-10-03 MED ORDER — ZOLPIDEM TARTRATE 5 MG PO TABS: 5.0000 mg | ORAL_TABLET | Freq: Every evening | ORAL | Status: DC | PRN

## 2011-10-03 MED ORDER — TETANUS-DIPHTH-ACELL PERTUSSIS 5-2.5-18.5 LF-MCG/0.5 IM SUSP: 0.5000 mL | Freq: Once | INTRAMUSCULAR | Status: DC

## 2011-10-03 MED ORDER — MIDAZOLAM HCL 2 MG/2ML IJ SOLN: 0.5000 mg | Freq: Once | INTRAMUSCULAR | Status: DC | PRN

## 2011-10-03 MED ORDER — KETOROLAC TROMETHAMINE 30 MG/ML IJ SOLN: 30.0000 mg | Freq: Four times a day (QID) | INTRAMUSCULAR | Status: AC | PRN

## 2011-10-03 MED ORDER — SIMETHICONE 80 MG PO CHEW: 80.0000 mg | CHEWABLE_TABLET | ORAL | Status: DC | PRN

## 2011-10-03 MED ORDER — WITCH HAZEL-GLYCERIN EX PADS: 1.0000 "application " | MEDICATED_PAD | CUTANEOUS | Status: DC | PRN

## 2011-10-03 MED ORDER — MENTHOL 3 MG MT LOZG: 1.0000 | LOZENGE | OROMUCOSAL | Status: DC | PRN

## 2011-10-03 MED ORDER — OXYTOCIN 20 UNITS IN LACTATED RINGERS INFUSION - SIMPLE
INTRAVENOUS | Status: DC | PRN
  Administered 2011-10-03: 20 [IU] via INTRAVENOUS

## 2011-10-03 MED ORDER — ONDANSETRON HCL 4 MG/2ML IJ SOLN
INTRAMUSCULAR | Status: DC | PRN
  Administered 2011-10-03: 4 mg via INTRAVENOUS

## 2011-10-03 MED ORDER — METOCLOPRAMIDE HCL 5 MG/ML IJ SOLN: 10.0000 mg | Freq: Three times a day (TID) | INTRAMUSCULAR | Status: DC | PRN

## 2011-10-03 MED ORDER — OXYTOCIN 20 UNITS IN LACTATED RINGERS INFUSION - SIMPLE
125.0000 mL/h | INTRAVENOUS | Status: AC
  Administered 2011-10-03: 125 mL/h via INTRAVENOUS

## 2011-10-03 MED ORDER — NALBUPHINE SYRINGE 5 MG/0.5 ML
5.0000 mg | INJECTION | INTRAMUSCULAR | Status: DC | PRN
  Filled 2011-10-03: qty 1

## 2011-10-03 MED ORDER — PROMETHAZINE HCL 25 MG/ML IJ SOLN: 6.2500 mg | INTRAMUSCULAR | Status: DC | PRN

## 2011-10-03 MED ORDER — LACTATED RINGERS IV SOLN
INTRAVENOUS | Status: DC
  Administered 2011-10-03: 17:00:00 via INTRAVENOUS

## 2011-10-03 MED ORDER — IBUPROFEN 600 MG PO TABS
600.0000 mg | ORAL_TABLET | Freq: Four times a day (QID) | ORAL | Status: DC
  Administered 2011-10-03 – 2011-10-06 (×11): 600 mg via ORAL
  Filled 2011-10-03 (×11): qty 1

## 2011-10-03 MED ORDER — LANOLIN HYDROUS EX OINT: 1.0000 "application " | TOPICAL_OINTMENT | CUTANEOUS | Status: DC | PRN

## 2011-10-03 MED ORDER — MEPERIDINE HCL 25 MG/ML IJ SOLN: 6.2500 mg | INTRAMUSCULAR | Status: DC | PRN

## 2011-10-03 MED ORDER — LACTATED RINGERS IV SOLN
INTRAVENOUS | Status: DC
  Administered 2011-10-03 (×3): via INTRAVENOUS

## 2011-10-03 MED ORDER — FENTANYL CITRATE 0.05 MG/ML IJ SOLN
INTRAMUSCULAR | Status: AC
  Filled 2011-10-03: qty 2

## 2011-10-03 MED ORDER — FENTANYL CITRATE 0.05 MG/ML IJ SOLN
INTRAMUSCULAR | Status: AC
Start: 2011-10-03 — End: 2011-10-03
  Administered 2011-10-03: 50 ug via INTRAVENOUS
  Filled 2011-10-03: qty 2

## 2011-10-03 MED ORDER — ONDANSETRON HCL 4 MG/2ML IJ SOLN
INTRAMUSCULAR | Status: AC
  Filled 2011-10-03: qty 2

## 2011-10-03 MED ORDER — DIPHENHYDRAMINE HCL 50 MG/ML IJ SOLN: 12.5000 mg | INTRAMUSCULAR | Status: DC | PRN

## 2011-10-03 MED ORDER — SODIUM CHLORIDE 0.9 % IV SOLN
1.0000 ug/kg/h | INTRAVENOUS | Status: DC | PRN
  Filled 2011-10-03: qty 2.5

## 2011-10-03 MED ORDER — OXYTOCIN 20 UNITS IN LACTATED RINGERS INFUSION - SIMPLE
INTRAVENOUS | Status: AC
  Administered 2011-10-03: 125 mL/h via INTRAVENOUS
  Filled 2011-10-03: qty 1000

## 2011-10-03 MED ORDER — DIBUCAINE 1 % RE OINT: 1.0000 "application " | TOPICAL_OINTMENT | RECTAL | Status: DC | PRN

## 2011-10-03 MED ORDER — DIPHENHYDRAMINE HCL 50 MG/ML IJ SOLN: 25.0000 mg | INTRAMUSCULAR | Status: DC | PRN

## 2011-10-03 MED ORDER — SENNOSIDES-DOCUSATE SODIUM 8.6-50 MG PO TABS
2.0000 | ORAL_TABLET | Freq: Every day | ORAL | Status: DC
  Administered 2011-10-03 – 2011-10-05 (×3): 2 via ORAL

## 2011-10-03 MED ORDER — ONDANSETRON HCL 4 MG PO TABS: 4.0000 mg | ORAL_TABLET | ORAL | Status: DC | PRN

## 2011-10-03 MED ORDER — OXYCODONE-ACETAMINOPHEN 5-325 MG PO TABS
1.0000 | ORAL_TABLET | ORAL | Status: DC | PRN
  Administered 2011-10-03 – 2011-10-04 (×2): 1 via ORAL
  Administered 2011-10-04: 2 via ORAL
  Administered 2011-10-04 (×2): 1 via ORAL
  Administered 2011-10-04 – 2011-10-05 (×3): 2 via ORAL
  Administered 2011-10-05: 1 via ORAL
  Administered 2011-10-05 – 2011-10-06 (×2): 2 via ORAL
  Filled 2011-10-03 (×4): qty 2
  Filled 2011-10-03 (×3): qty 1
  Filled 2011-10-03: qty 2
  Filled 2011-10-03 (×3): qty 1
  Filled 2011-10-03: qty 2

## 2011-10-03 MED ORDER — KETOROLAC TROMETHAMINE 30 MG/ML IJ SOLN
INTRAMUSCULAR | Status: AC
  Administered 2011-10-03: 5 mg via INTRAVENOUS
  Filled 2011-10-03: qty 1

## 2011-10-03 MED ORDER — ACETAMINOPHEN 325 MG PO TABS: 325.0000 mg | ORAL_TABLET | ORAL | Status: DC | PRN

## 2011-10-03 MED ORDER — KETOROLAC TROMETHAMINE 30 MG/ML IJ SOLN
30.0000 mg | Freq: Four times a day (QID) | INTRAMUSCULAR | Status: AC | PRN
  Administered 2011-10-03: 25 mg via INTRAVENOUS
  Administered 2011-10-03: 5 mg via INTRAVENOUS

## 2011-10-03 MED ORDER — ONDANSETRON HCL 4 MG/2ML IJ SOLN: 4.0000 mg | Freq: Three times a day (TID) | INTRAMUSCULAR | Status: DC | PRN

## 2011-10-03 MED ORDER — MORPHINE SULFATE 0.5 MG/ML IJ SOLN
INTRAMUSCULAR | Status: AC
  Filled 2011-10-03: qty 10

## 2011-10-03 MED ORDER — DIPHENHYDRAMINE HCL 25 MG PO CAPS: 25.0000 mg | ORAL_CAPSULE | ORAL | Status: DC | PRN

## 2011-10-03 SURGICAL SUPPLY — 30 items
ADH SKN CLS APL DERMABOND .7 (GAUZE/BANDAGES/DRESSINGS) ×1
CLOTH BEACON ORANGE TIMEOUT ST (SAFETY) ×2 IMPLANT
CONTAINER PREFILL 10% NBF 15ML (MISCELLANEOUS) ×4 IMPLANT
DERMABOND ADVANCED (GAUZE/BANDAGES/DRESSINGS) ×1
DERMABOND ADVANCED .7 DNX12 (GAUZE/BANDAGES/DRESSINGS) ×1 IMPLANT
ELECT REM PT RETURN 9FT ADLT (ELECTROSURGICAL) ×2
ELECTRODE REM PT RTRN 9FT ADLT (ELECTROSURGICAL) ×1 IMPLANT
EXTRACTOR VACUUM M CUP 4 TUBE (SUCTIONS) IMPLANT
GLOVE BIO SURGEON STRL SZ8.5 (GLOVE) ×4 IMPLANT
GOWN PREVENTION PLUS LG XLONG (DISPOSABLE) ×4 IMPLANT
GOWN PREVENTION PLUS XXLARGE (GOWN DISPOSABLE) ×2 IMPLANT
KIT ABG SYR 3ML LUER SLIP (SYRINGE) IMPLANT
NDL HYPO 25X5/8 SAFETYGLIDE (NEEDLE) IMPLANT
NEEDLE HYPO 25X5/8 SAFETYGLIDE (NEEDLE) IMPLANT
NS IRRIG 1000ML POUR BTL (IV SOLUTION) ×2 IMPLANT
PACK C SECTION WH (CUSTOM PROCEDURE TRAY) ×2 IMPLANT
PENCIL BUTTON HOLSTER BLD 10FT (ELECTRODE) ×1 IMPLANT
SLEEVE SCD COMPRESS KNEE MED (MISCELLANEOUS) IMPLANT
SUT CHROMIC 0 CT 802H (SUTURE) ×2 IMPLANT
SUT CHROMIC 1 CTX 36 (SUTURE) ×4 IMPLANT
SUT CHROMIC 2 0 SH (SUTURE) ×2 IMPLANT
SUT GUT PLAIN 0 CT-3 TAN 27 (SUTURE) ×2 IMPLANT
SUT MON AB 4-0 PS1 27 (SUTURE) ×2 IMPLANT
SUT VIC AB 0 CT1 18XCR BRD8 (SUTURE) IMPLANT
SUT VIC AB 0 CT1 8-18 (SUTURE)
SUT VIC AB 0 CTX 36 (SUTURE) ×4
SUT VIC AB 0 CTX36XBRD ANBCTRL (SUTURE) ×2 IMPLANT
TOWEL OR 17X24 6PK STRL BLUE (TOWEL DISPOSABLE) ×4 IMPLANT
TRAY FOLEY CATH 14FR (SET/KITS/TRAYS/PACK) ×2 IMPLANT
WATER STERILE IRR 1000ML POUR (IV SOLUTION) ×2 IMPLANT

## 2011-10-03 NOTE — Progress Notes (Signed)
No allergic reaction notied from the Toradol test dose.

## 2011-10-03 NOTE — Anesthesia Postprocedure Evaluation (Signed)
Anesthesia Post Note  Patient: Shelby Harris  Procedure(s) Performed: Procedure(s) (LRB): CESAREAN SECTION WITH BILATERAL TUBAL LIGATION (Bilateral)  Anesthesia type: Spinal  Patient location: PACU  Post pain: Pain level controlled  Post assessment: Post-op Vital signs reviewed  Last Vitals:  Filed Vitals:   10/03/11 1115  BP: 142/82  Pulse: 72  Temp:   Resp: 18    Post vital signs: Reviewed  Level of consciousness: awake  Complications: No apparent anesthesia complications

## 2011-10-03 NOTE — Op Note (Signed)
preop diagnosis previous cesarean section at term multiparity desires repeat C-section and tubal ligation Postop diagnosis the same Anesthesia spinal Surgeon Dr. Francoise Ceo Dr. Coral Ceo Procedure patient placed on the operating table in supine position after the spinal administered abdomen prepped and draped data and to the Foley catheter a transverse suprapubic incision made below scar carried down to the rectus fascia fascia cleaned and incised and the incision recti muscles retracted laterally peritoneum incised longitudinally transverse incision made in the visceroperitoneum above the bladder bladder mobilized inferiorly transverse low uterine incision made fluid was clear patient delivered from the OA position of a female Apgar 9 and 9 the team was in attendance the placenta was fundal removed manually and sent to labor and delivery uterine cavity clean and dry laps uterine incision closed in one layer with continuous looped abnormal one chromic bladder flap reattached to a chromic the right tube GRAS in the midportion of the a Babcock clamp 0 pencil-tip this is a [place] of the cord was clamped this a stat an approximately one-inch to transect the procedure done a similar fashion is not and sponge counts correct abdomen closed in layers actinium continuous with of 0 chromic fascia contiguous with 0 Dexon skin shows a subcuticular stitch of 40 Motrin blood loss was 800 cc patient tolerated procedure well taken to recovery room in good condition end of dictation dictated by Dr. Gaynell Face for 313 thank you and

## 2011-10-03 NOTE — Progress Notes (Signed)
Discussed Aspirin Allergy and Toradol order with Dr. Sheral Apley. Dr. Neale Burly discussed allergy with patient. Dr. Sheral Apley requested to give a test dose of 5 mgm. If no reaction notied remaining 25 mgm Toadol to be given.

## 2011-10-03 NOTE — Addendum Note (Signed)
Addendum  created 10/03/11 1636 by Algis Greenhouse, CRNA   Modules edited:Notes Section

## 2011-10-03 NOTE — Anesthesia Procedure Notes (Signed)

## 2011-10-03 NOTE — Anesthesia Postprocedure Evaluation (Signed)
  Anesthesia Post Note  Patient: Shelby Harris  Procedure(s) Performed: Procedure(s) (LRB): CESAREAN SECTION WITH BILATERAL TUBAL LIGATION (Bilateral)  Anesthesia type: Spinal  Patient location: Mother/Baby  Post pain: Pain level controlled  Post assessment: Post-op Vital signs reviewed  Last Vitals:  Filed Vitals:   10/03/11 1458  BP:   Pulse: 79  Temp:   Resp: 18    Post vital signs: Reviewed  Level of consciousness: awake  Complications: No apparent anesthesia complications

## 2011-10-03 NOTE — Anesthesia Preprocedure Evaluation (Signed)
Anesthesia Evaluation  Patient identified by MRN, date of birth, ID band Patient awake    Reviewed: Allergy & Precautions, H&P , NPO status , Patient's Chart, lab work & pertinent test results  Airway Mallampati: III      Dental No notable dental hx.    Pulmonary neg pulmonary ROS,  breath sounds clear to auscultation  Pulmonary exam normal       Cardiovascular Exercise Tolerance: Good negative cardio ROS  Rhythm:regular Rate:Normal     Neuro/Psych negative neurological ROS  negative psych ROS   GI/Hepatic negative GI ROS, Neg liver ROS,   Endo/Other  negative endocrine ROSHyperthyroidism   Renal/GU negative Renal ROS  negative genitourinary   Musculoskeletal   Abdominal Normal abdominal exam  (+)   Peds  Hematology negative hematology ROS (+)   Anesthesia Other Findings   Reproductive/Obstetrics (+) Pregnancy                           Anesthesia Physical Anesthesia Plan  ASA: II  Anesthesia Plan: Spinal   Post-op Pain Management:    Induction:   Airway Management Planned:   Additional Equipment:   Intra-op Plan:   Post-operative Plan:   Informed Consent: I have reviewed the patients History and Physical, chart, labs and discussed the procedure including the risks, benefits and alternatives for the proposed anesthesia with the patient or authorized representative who has indicated his/her understanding and acceptance.     Plan Discussed with: Anesthesiologist, CRNA and Surgeon  Anesthesia Plan Comments:         Anesthesia Quick Evaluation

## 2011-10-03 NOTE — Transfer of Care (Signed)
Immediate Anesthesia Transfer of Care Note  Patient: Shelby Harris  Procedure(s) Performed: Procedure(s) (LRB): CESAREAN SECTION WITH BILATERAL TUBAL LIGATION (Bilateral)  Patient Location: PACU  Anesthesia Type: Spinal  Level of Consciousness: awake  Airway & Oxygen Therapy: Patient Spontanous Breathing  Post-op Assessment: Report given to PACU RN  Post vital signs: Reviewed and stable  Complications: No apparent anesthesia complications

## 2011-10-03 NOTE — H&P (Signed)
  There has been no change in her history and her physical exam today is in an also unchanged

## 2011-10-04 LAB — CBC
HCT: 29.7 % — ABNORMAL LOW (ref 36.0–46.0)
MCHC: 31.3 g/dL (ref 30.0–36.0)
Platelets: 274 10*3/uL (ref 150–400)
RDW: 14.8 % (ref 11.5–15.5)
WBC: 11.7 10*3/uL — ABNORMAL HIGH (ref 4.0–10.5)

## 2011-10-04 NOTE — Progress Notes (Signed)
UR Chart review completed.  

## 2011-10-04 NOTE — Progress Notes (Signed)
Patient ID: Shelby Harris, female   DOB: [birth year], 36 y.o.   MRN: [id number] Postop day 1 Vital signs normal Fundus firm Lochia moderate Legs negative No complaints doing well

## 2011-10-05 NOTE — Progress Notes (Signed)
Patient ID: Shelby Harris, female   DOB: 11/01/1975, 36 y.o.   MRN: [id number] Postop day #2 Vital signs normal Fundus firm Legs negative No complaints

## 2011-10-06 NOTE — Discharge Instructions (Signed)
Discharge instructions   You can wash your hair  Shower  Eat what you want  Drink what you want  See me in 6 weeks  Your ankles are going to swell more in the next 2 weeks than when pregnant  No sex for 6 weeks   Chamaine Stankus A, MD 10/06/2011    

## 2011-10-06 NOTE — Discharge Summary (Signed)
Obstetric Discharge Summary Reason for Admission: cesarean section Prenatal Procedures: none Intrapartum Procedures: cesarean: low cervical, transverse Postpartum Procedures: none Complications-Operative and Postpartum: none Hemoglobin  Date Value Range Status  10/04/2011 9.3* 12.0-15.0 (g/dL) Final     HCT  Date Value Range Status  10/04/2011 29.7* 36.0-46.0 (%) Final    Physical Exam:  General: alert Lochia: appropriate Uterine Fundus: firm Incision: healing well DVT Evaluation: No evidence of DVT seen on physical exam.  Discharge Diagnoses: Term Pregnancy-delivered  Discharge Information: Date: 10/06/2011 Activity: pelvic rest Diet: routine Medications: Percocet Condition: stable Instructions: refer to practice specific booklet Discharge to: home Follow-up Information    Follow up with Talvin Christianson A, MD. Call in 6 weeks.   Contact information:   [address] [place] [address] [phone]          Newborn Data: Live born female  Birth Weight: 6 lb 2.1 oz (2780 g) APGAR: 9, 9  Home with mother.  Boaz Berisha A 10/06/2011, 3:43 AM

## 2011-11-16 ENCOUNTER — Encounter: Payer: Self-pay | Admitting: Family Medicine

## 2011-11-21 ENCOUNTER — Ambulatory Visit (INDEPENDENT_AMBULATORY_CARE_PROVIDER_SITE_OTHER): Payer: Medicaid Other | Admitting: Family Medicine

## 2011-11-21 ENCOUNTER — Encounter: Payer: Self-pay | Admitting: Family Medicine

## 2011-11-21 VITALS — BP 140/90 | HR 85 | Temp 98.4°F | Wt 192.0 lb

## 2011-11-21 DIAGNOSIS — I10 Essential (primary) hypertension: Secondary | ICD-10-CM

## 2011-11-21 LAB — BASIC METABOLIC PANEL
BUN: 8 mg/dL (ref 6–23)
Chloride: 109 mEq/L (ref 96–112)
Creatinine, Ser: 0.8 mg/dL (ref 0.4–1.2)
GFR: 99.97 mL/min (ref >60.00)
Potassium: 4.3 mEq/L (ref 3.5–5.1)

## 2011-11-21 LAB — TSH: TSH: 0.73 u[IU]/mL (ref 0.35–5.50)

## 2011-11-21 NOTE — Progress Notes (Signed)
  Subjective:    Patient ID: Shelby Harris, female    DOB: 03/03/1976, 36 y.o.   MRN: [id number]  HPI Here at the request of her ObGYN, Dr. Francoise Ceo, for HTN. She gave birth by C section on 10-03-11, and this went very well. Her pregnancy was unremarkable, and her BP was stable throughout. However shortly after delivery her BP started going up and was as high as 110 diastolic in the last month. She has felt fine other than some understandable fatigue. No HA or chest pain or SOB. She is breast feeding. She checks her BP several times a week, and it has been down a little, usually in the 140s-150s over 90s. Dr. Gaynell Face had given her a rx for Methyldopa 500 mg bid but she has not started this yet.    Review of Systems  Constitutional: Negative.   Respiratory: Negative.   Cardiovascular: Negative.   Neurological: Negative.        Objective:   Physical Exam  Constitutional: She appears well-developed and well-nourished.  Neck: Neck supple. No thyromegaly present.  Cardiovascular: Normal rate, regular rhythm, normal heart sounds and intact distal pulses.  Exam reveals no gallop and no friction rub.   No murmur heard. Pulmonary/Chest: Effort normal and breath sounds normal. No respiratory distress. She has no wheezes. She has no rales.  Musculoskeletal: She exhibits no edema.  Lymphadenopathy:    She has no cervical adenopathy.          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 I advised her to begin taking the Methyldopa, and we will see her back in 2 weeks. There is a good chance her BP will settle down after awhile,however, and she may be able to stop the med.

## 2011-12-01 ENCOUNTER — Encounter (HOSPITAL_COMMUNITY): Payer: Self-pay | Admitting: Family Medicine

## 2011-12-01 ENCOUNTER — Emergency Department (HOSPITAL_COMMUNITY)
Admission: EM | Admit: 2011-12-01 | Discharge: 2011-12-01 | Disposition: A | Discharge status: Home / Self Care | Payer: Medicaid Other | Attending: Emergency Medicine | Admitting: Emergency Medicine

## 2011-12-01 DIAGNOSIS — J019 Acute sinusitis, unspecified: Secondary | ICD-10-CM | POA: Insufficient documentation

## 2011-12-01 DIAGNOSIS — Z87891 Personal history of nicotine dependence: Secondary | ICD-10-CM | POA: Insufficient documentation

## 2011-12-01 DIAGNOSIS — I10 Essential (primary) hypertension: Secondary | ICD-10-CM | POA: Insufficient documentation

## 2011-12-01 DIAGNOSIS — D649 Anemia, unspecified: Secondary | ICD-10-CM | POA: Insufficient documentation

## 2011-12-01 HISTORY — DX: Essential (primary) hypertension: I10

## 2011-12-01 MED ORDER — PIMECROLIMUS 1 % EX CREA: TOPICAL_CREAM | Freq: Two times a day (BID) | CUTANEOUS | Status: DC

## 2011-12-01 MED ORDER — AZITHROMYCIN 250 MG PO TABS: 250.0000 mg | ORAL_TABLET | Freq: Every day | ORAL | Status: AC

## 2011-12-01 NOTE — ED Notes (Signed)
Pt reports having sore throat with slight headache starting today. States she has not taken any medications at home to treat this. Reports skin around toes on left foot is cracked due to eczema

## 2011-12-01 NOTE — ED Notes (Signed)
Pt states that her left foot has cracked skin from eczema. She states that she noted cloudy fluids draining from the 5th left toe. She breast feeds and didn't want to continue breastfeeding if she has an infection (sore throat and headache).

## 2011-12-01 NOTE — ED Provider Notes (Signed)
History     CSN: [id number]  Arrival date & time 12/01/11  1614   First MD Initiated Contact with Patient 12/01/11 1638      Chief Complaint  Patient presents with  . Sore Throat  . Skin Problem    skin around toes cracked due to eczema    (Consider location/radiation/quality/duration/timing/severity/associated sxs/prior treatment) HPI Comments: Patient reports that she has had congestion, post nasal drip, and sinus pressure for a couple of days.  Today she began having a sore throat.  She denies fever or chills.  She is also complaining of cracking in between her toes.  She has seen a Physician about this in the past and has been prescribed Elidel, which has helped.  She ran out of Elidel.    Patient is a 36 y.o. female presenting with pharyngitis. The history is provided by the patient.  Sore Throat This is a new problem. The current episode started today. Associated symptoms include congestion and a sore throat. Pertinent negatives include no chills, coughing, fever, nausea, neck pain, rash or vomiting. She has tried nothing for the symptoms.    Past Medical History  Diagnosis Date  . IBS (irritable bowel syndrome)   . Anemia   . Hyperthyroidism     with pregnancy  . Seizures     as child-pt states "outgrew"  . Hypertension     Past Surgical History  Procedure Date  . Cesarean section   . Tonsillectomy     as child    Family History  Problem Relation Age of Onset  . Diabetes      History  Substance Use Topics  . Smoking status: Former Smoker    Quit date: 07/21/2011  . Smokeless tobacco: Never Used  . Alcohol Use: No    OB History    Grav Para Term Preterm Abortions TAB SAB Ect Mult Living   2 2 2  0 0 0 0 0 0 2      Review of Systems  Constitutional: Negative for fever and chills.  HENT: Positive for congestion, sore throat, rhinorrhea, postnasal drip and sinus pressure. Negative for trouble swallowing, neck pain, neck stiffness and voice change.     Respiratory: Negative for cough and shortness of breath.   Gastrointestinal: Negative for nausea and vomiting.  Skin: Negative for rash.    Allergies  Aspirin  Home Medications   Current Outpatient Rx  Name Route Sig Dispense Refill  . IBUPROFEN 200 MG PO TABS Oral Take 200 mg by mouth every 6 (six) hours as needed. PAIN    . METHYLDOPA 500 MG PO TABS Oral Take 500 mg by mouth 2 (two) times daily.    . ADULT MULTIVITAMIN W/MINERALS CH Oral Take 1 tablet by mouth daily.      BP 141/87  Pulse 86  Temp(Src) 98.8 F (37.1 C) (Oral)  Resp 18  SpO2 98%  Breastfeeding? Yes  Physical Exam  Nursing note and vitals reviewed. Constitutional: She appears well-developed and well-nourished. No distress.  HENT:  Head: Normocephalic and atraumatic. No trismus in the jaw.  Right Ear: Tympanic membrane and ear canal normal.  Left Ear: Tympanic membrane and ear canal normal.  Nose: Mucosal edema present. Right sinus exhibits frontal sinus tenderness. Right sinus exhibits no maxillary sinus tenderness. Left sinus exhibits frontal sinus tenderness. Left sinus exhibits no maxillary sinus tenderness.  Mouth/Throat: Uvula is midline, oropharynx is clear and moist and mucous membranes are normal. No oropharyngeal exudate, posterior oropharyngeal edema, posterior oropharyngeal  erythema or tonsillar abscesses.  Neck: Normal range of motion. Neck supple.  Cardiovascular: Normal rate, regular rhythm and normal heart sounds.   Pulmonary/Chest: Effort normal and breath sounds normal. No respiratory distress. She has no wheezes. She has no rales.  Neurological: She is alert.  Skin: Skin is warm and dry. She is not diaphoretic.  Psychiatric: She has a normal mood and affect.    ED Course  Procedures (including critical care time)  Labs Reviewed - No data to display No results found.   1. Acute sinusitis       MDM  History and physical consistent with Acute Sinusitis.          Pascal Lux [place], PA-C 12/02/11 1217

## 2011-12-02 NOTE — ED Provider Notes (Signed)
Medical screening examination/treatment/procedure(s) were performed by non-physician practitioner and as supervising physician I was immediately available for consultation/collaboration.  Toy Baker, MD 12/02/11 [phone]

## 2011-12-05 ENCOUNTER — Ambulatory Visit (INDEPENDENT_AMBULATORY_CARE_PROVIDER_SITE_OTHER): Payer: Medicaid Other | Admitting: Family Medicine

## 2011-12-05 ENCOUNTER — Encounter: Payer: Self-pay | Admitting: Family Medicine

## 2011-12-05 VITALS — BP 126/78 | HR 80 | Temp 97.7°F | Wt 191.0 lb

## 2011-12-05 DIAGNOSIS — I10 Essential (primary) hypertension: Secondary | ICD-10-CM

## 2011-12-05 MED ORDER — METHYLDOPA 500 MG PO TABS: 500.0000 mg | ORAL_TABLET | Freq: Two times a day (BID) | ORAL | Status: DC

## 2011-12-05 NOTE — Progress Notes (Signed)
  Subjective:    Patient ID: Shelby Harris, female    DOB: 1976/04/14, 36 y.o.   MRN: [id number]  HPI Here to recheck her BP after taking Methyldopa. She feels great and has no side effects. Her BP is stable now. She is still breast feeding her newborn.    Review of Systems  Constitutional: Negative.   Respiratory: Negative.   Cardiovascular: Negative.        Objective:   Physical Exam  Constitutional: She appears well-developed and well-nourished.  Cardiovascular: Normal rate, regular rhythm, normal heart sounds and intact distal pulses.   Pulmonary/Chest: Effort normal and breath sounds normal.          Assessment & Plan:  Stay on the current regimen. Recheck in 90 days

## 2012-02-15 ENCOUNTER — Encounter (HOSPITAL_COMMUNITY): Payer: Self-pay | Admitting: *Deleted

## 2012-02-15 ENCOUNTER — Emergency Department (HOSPITAL_COMMUNITY)
Admission: EM | Admit: 2012-02-15 | Discharge: 2012-02-16 | Disposition: A | Discharge status: Home / Self Care | Payer: Medicaid Other | Attending: Emergency Medicine | Admitting: Emergency Medicine

## 2012-02-15 DIAGNOSIS — F17200 Nicotine dependence, unspecified, uncomplicated: Secondary | ICD-10-CM | POA: Insufficient documentation

## 2012-02-15 DIAGNOSIS — Z79899 Other long term (current) drug therapy: Secondary | ICD-10-CM | POA: Insufficient documentation

## 2012-02-15 DIAGNOSIS — Z886 Allergy status to analgesic agent status: Secondary | ICD-10-CM | POA: Insufficient documentation

## 2012-02-15 DIAGNOSIS — I10 Essential (primary) hypertension: Secondary | ICD-10-CM

## 2012-02-15 DIAGNOSIS — J329 Chronic sinusitis, unspecified: Secondary | ICD-10-CM | POA: Insufficient documentation

## 2012-02-15 DIAGNOSIS — K589 Irritable bowel syndrome without diarrhea: Secondary | ICD-10-CM | POA: Insufficient documentation

## 2012-02-15 NOTE — ED Notes (Signed)
Pt here with c/o headache, body aches, sore throat. Headache started 3 days ago, other symptoms began yesterday.

## 2012-02-16 LAB — RAPID STREP SCREEN (MED CTR MEBANE ONLY): Streptococcus, Group A Screen (Direct): NEGATIVE

## 2012-02-16 MED ORDER — HYDROMORPHONE HCL PF 1 MG/ML IJ SOLN
1.0000 mg | Freq: Once | INTRAMUSCULAR | Status: AC
  Administered 2012-02-16: 1 mg via INTRAMUSCULAR
  Filled 2012-02-16: qty 1

## 2012-02-16 MED ORDER — HYDROCODONE-ACETAMINOPHEN 5-500 MG PO TABS: 1.0000 | ORAL_TABLET | Freq: Four times a day (QID) | ORAL | Status: AC | PRN

## 2012-02-16 MED ORDER — DEXAMETHASONE SODIUM PHOSPHATE 10 MG/ML IJ SOLN: 10.0000 mg | Freq: Once | INTRAMUSCULAR | Status: DC

## 2012-02-16 MED ORDER — DEXAMETHASONE SODIUM PHOSPHATE 10 MG/ML IJ SOLN
10.0000 mg | Freq: Once | INTRAMUSCULAR | Status: AC
  Administered 2012-02-16: 10 mg via INTRAMUSCULAR
  Filled 2012-02-16: qty 1

## 2012-02-16 MED ORDER — HYDROMORPHONE HCL PF 1 MG/ML IJ SOLN: 1.0000 mg | Freq: Once | INTRAMUSCULAR | Status: DC

## 2012-02-16 MED ORDER — AZITHROMYCIN 250 MG PO TABS: 250.0000 mg | ORAL_TABLET | Freq: Every day | ORAL | Status: AC

## 2012-02-16 MED ORDER — ONDANSETRON 4 MG PO TBDP
4.0000 mg | ORAL_TABLET | Freq: Once | ORAL | Status: AC
Start: 2012-02-16 — End: 2012-02-16
  Administered 2012-02-16: 4 mg via ORAL
  Filled 2012-02-16: qty 1

## 2012-02-16 MED ORDER — AZITHROMYCIN 250 MG PO TABS
500.0000 mg | ORAL_TABLET | Freq: Once | ORAL | Status: AC
  Administered 2012-02-16: 500 mg via ORAL
  Filled 2012-02-16: qty 2

## 2012-02-16 NOTE — ED Provider Notes (Signed)
History     CSN: [id number]  Arrival date & time 02/15/12  2232   First MD Initiated Contact with Patient 02/15/12 2337      Chief Complaint  Patient presents with  . Headache  . Fever  . Generalized Body Aches    (Consider location/radiation/quality/duration/timing/severity/associated sxs/prior treatment) HPI  Patient presents to the ER with complaints of sore throat, muscles aches, sinus tenderness and a sore throat for 3 days. She states that the most bothersome symptoms is her sore throat and body aches. She has not taken her temperature but has felt warm to the touch. She admits to having a history of headaches. She took Ibuprofen this morning for pain and it helped a little bit, she has not taken anything since. She denies neck soreness, difficulty eating or swallowing. She denies N/V/D, weakness, lethargy.  She denies difficulty breathing, chest pain or recent URI symptoms.  VSS, NAD. Patient was snoring when i walked into exam room.  Past Medical History  Diagnosis Date  . IBS (irritable bowel syndrome)   . Anemia   . Hyperthyroidism     with pregnancy  . Hypertension     Past Surgical History  Procedure Date  . Cesarean section   . Tonsillectomy     as child    Family History  Problem Relation Age of Onset  . Diabetes      History  Substance Use Topics  . Smoking status: Current Everyday Smoker    Last Attempt to Quit: 07/21/2011  . Smokeless tobacco: Never Used   Comment: 2-3 cigarettes a day  . Alcohol Use: No    OB History    Grav Para Term Preterm Abortions TAB SAB Ect Mult Living   2 2 2  0 0 0 0 0 0 2      Review of Systems   HEENT: denies blurry vision or change in hearing PULMONARY: Denies difficulty breathing and SOB CARDIAC: denies chest pain or heart palpitations MUSCULOSKELETAL:  denies being unable to ambulate ABDOMEN AL: denies abdominal pain GU: denies loss of bowel or urinary control NEURO: denies numbness and tingling in  extremities SKIN: no new rashes PSYCH: patient denies anxiety or depression. NECK: Pt denies having neck pain     Allergies  Aspirin  Home Medications   Current Outpatient Rx  Name Route Sig Dispense Refill  . ACETAMINOPHEN 500 MG PO TABS Oral Take 1,000 mg by mouth every 6 (six) hours as needed. For pain or fever    . IBUPROFEN 200 MG PO TABS Oral Take 800 mg by mouth every 8 (eight) hours as needed. For pain    . METHYLDOPA 500 MG PO TABS Oral Take 1 tablet (500 mg total) by mouth 2 (two) times daily. 60 tablet 11  . PIMECROLIMUS 1 % EX CREA Topical Apply 1 application topically as needed. For eczema    . AZITHROMYCIN 250 MG PO TABS Oral Take 1 tablet (250 mg total) by mouth daily. Take first 2 tablets together, then 1 every day until finished. 6 tablet 0  . HYDROCODONE-ACETAMINOPHEN 5-500 MG PO TABS Oral Take 1 tablet by mouth every 6 (six) hours as needed for pain. 15 tablet 0    BP 188/76  Pulse 90  Temp 98.6 F (37 C) (Oral)  SpO2 98%  Breastfeeding? Unknown  Physical Exam  Nursing note and vitals reviewed. Constitutional: She is oriented to person, place, and time. She appears well-developed and well-nourished. No distress.  HENT:  Head: Normocephalic and  atraumatic. No trismus in the jaw.    Right Ear: Tympanic membrane, external ear and ear canal normal.  Left Ear: Tympanic membrane, external ear and ear canal normal.  Nose: Sinus tenderness (maxillofacial tenderness) present. No rhinorrhea. Right sinus exhibits no maxillary sinus tenderness and no frontal sinus tenderness. Left sinus exhibits no maxillary sinus tenderness and no frontal sinus tenderness.  Mouth/Throat: Uvula is midline and mucous membranes are normal. Normal dentition. No dental abscesses or uvula swelling. Oropharyngeal exudate and posterior oropharyngeal edema present. No posterior oropharyngeal erythema or tonsillar abscesses.       No submental edema, tongue not elevated, no trismus. No  impending airway obstruction; Pt able to speak full sentences, swallow intact, no drooling, stridor, or tonsillar/uvula displacement. No palatal petechia  Eyes: Conjunctivae are normal. Pupils are equal, round, and reactive to light.  Neck: Trachea normal, normal range of motion and full passive range of motion without pain. Neck supple. No rigidity. Erythema present. Normal range of motion present. No Brudzinski's sign and no Kernig's sign noted.       Flexion and extension of neck without pain or difficulty. Able to breath without difficulty in extension.  Cardiovascular: Normal rate and regular rhythm.   Pulmonary/Chest: Effort normal and breath sounds normal. No stridor. No respiratory distress. She has no wheezes.  Abdominal: Soft. There is no tenderness.       No obvious evidence of splenomegaly. Non ttp.   Musculoskeletal: Normal range of motion.  Lymphadenopathy:       Head (right side): No preauricular and no posterior auricular adenopathy present.       Head (left side): No preauricular and no posterior auricular adenopathy present.    She has cervical adenopathy.  Neurological: She is alert and oriented to person, place, and time. She has normal strength. She displays a negative Romberg sign. GCS eye subscore is 4. GCS verbal subscore is 5. GCS motor subscore is 6.  Skin: Skin is warm and dry. No rash noted. She is not diaphoretic.  Psychiatric: She has a normal mood and affect.    ED Course  Procedures (including critical care time)   Labs Reviewed  RAPID STREP SCREEN   No results found.   1. Sinus infection   2. Sinus headache       MDM  Patient given 10mg  IM  Decadron, 4mg  IM Dilaudid for headache and sore throat. She was given Azithromycin PO  For sinus infection and Zofran 4mg  PO for nausea.   She was feeling much better before dc. SHe has a pcp who told her to go see on Monday.   Patient also has high BP at visit today. She was informed of this and that she  needs to discuss with her PCP.  Pt has been advised of the symptoms that warrant their return to the ED. Patient has voiced understanding and has agreed to follow-up with the PCP or specialist.         Dorthula Matas, PA 02/16/12 0106

## 2012-12-26 ENCOUNTER — Other Ambulatory Visit: Payer: Self-pay | Admitting: Family Medicine

## 2012-12-26 NOTE — Telephone Encounter (Signed)
Call in one month supply only. She needs an OV soon

## 2013-05-16 ENCOUNTER — Encounter (HOSPITAL_COMMUNITY): Payer: Self-pay | Admitting: Emergency Medicine

## 2013-05-16 ENCOUNTER — Emergency Department (HOSPITAL_COMMUNITY): Payer: Medicaid Other

## 2013-05-16 ENCOUNTER — Emergency Department (HOSPITAL_COMMUNITY)
Admission: EM | Admit: 2013-05-16 | Discharge: 2013-05-16 | Disposition: A | Discharge status: Home / Self Care | Payer: Medicaid Other | Attending: Emergency Medicine | Admitting: Emergency Medicine

## 2013-05-16 DIAGNOSIS — F17200 Nicotine dependence, unspecified, uncomplicated: Secondary | ICD-10-CM | POA: Insufficient documentation

## 2013-05-16 DIAGNOSIS — Z862 Personal history of diseases of the blood and blood-forming organs and certain disorders involving the immune mechanism: Secondary | ICD-10-CM | POA: Insufficient documentation

## 2013-05-16 DIAGNOSIS — Y929 Unspecified place or not applicable: Secondary | ICD-10-CM | POA: Insufficient documentation

## 2013-05-16 DIAGNOSIS — M25532 Pain in left wrist: Secondary | ICD-10-CM

## 2013-05-16 DIAGNOSIS — I10 Essential (primary) hypertension: Secondary | ICD-10-CM | POA: Insufficient documentation

## 2013-05-16 DIAGNOSIS — S6990XA Unspecified injury of unspecified wrist, hand and finger(s), initial encounter: Secondary | ICD-10-CM | POA: Insufficient documentation

## 2013-05-16 DIAGNOSIS — X500XXA Overexertion from strenuous movement or load, initial encounter: Secondary | ICD-10-CM | POA: Insufficient documentation

## 2013-05-16 DIAGNOSIS — Z8639 Personal history of other endocrine, nutritional and metabolic disease: Secondary | ICD-10-CM | POA: Insufficient documentation

## 2013-05-16 DIAGNOSIS — Z8719 Personal history of other diseases of the digestive system: Secondary | ICD-10-CM | POA: Insufficient documentation

## 2013-05-16 DIAGNOSIS — Y9389 Activity, other specified: Secondary | ICD-10-CM | POA: Insufficient documentation

## 2013-05-16 DIAGNOSIS — S59909A Unspecified injury of unspecified elbow, initial encounter: Secondary | ICD-10-CM | POA: Insufficient documentation

## 2013-05-16 MED ORDER — HYDROCODONE-ACETAMINOPHEN 5-325 MG PO TABS
1.0000 | ORAL_TABLET | Freq: Once | ORAL | Status: AC
  Administered 2013-05-16: 1 via ORAL
  Filled 2013-05-16: qty 1

## 2013-05-16 MED ORDER — IBUPROFEN 600 MG PO TABS: 600.0000 mg | ORAL_TABLET | Freq: Three times a day (TID) | ORAL | Status: DC | PRN

## 2013-05-16 MED ORDER — HYDROCODONE-ACETAMINOPHEN 5-325 MG PO TABS: 1.0000 | ORAL_TABLET | ORAL | Status: DC | PRN

## 2013-05-16 NOTE — ED Notes (Addendum)
Per pt report: Pt hurt left wrist when pt was trying to catch child yesterday.  Pt reports that it "bent wrong, backwards."  Swelling noted on pt's wrist and pt reports some tingling in pt's little finger.  Pt a/o x 4.  Skin warm and dry. Pt took 800mg  ibuprofen for pain yesterday but has not had any medications today.

## 2013-05-16 NOTE — ED Provider Notes (Signed)
Medical screening examination/treatment/procedure(s) were performed by non-physician practitioner and as supervising physician I was immediately available for consultation/collaboration.    Amair Shrout R Samani Deal, MD 05/16/13 2351 

## 2013-05-16 NOTE — ED Provider Notes (Signed)
CSN: [id number]     Arrival date & time 05/16/13  1943 History  This chart was scribed for non-physician practitioner Trixie Dredge, PA-C, working with Celene Kras, MD, by Yevette Edwards, ED Scribe. This patient was seen in room WTR7/WTR7 and the patient's care was started at 8:05 PM.   First MD Initiated Contact with Patient 05/16/13 2004     Chief Complaint  Patient presents with  . Wrist Pain    Patient is a 37 y.o. female presenting with wrist pain. The history is provided by the patient. No language interpreter was used.  Wrist Pain This is a new problem. The current episode started yesterday. The problem occurs constantly. The problem has not changed since onset.Nothing relieves the symptoms. She has tried acetaminophen for the symptoms.   HPI Comments: Shelby Harris is a 37 y.o. female who presents to the Emergency Department complaining of acute pain to her left wrist which occurred yesterday while she was playing with her child. The pt reports that her wrist bent backwards, and she is experiencing "aching", constant pain to the wrist. The pt states the pain "shoots" up her forearm. She denies any pain to her fingers or elbow. She also denies any numbness or paresthesia to her fingers.   She is not breast-feeding.   Past Medical History  Diagnosis Date  . IBS (irritable bowel syndrome)   . Anemia   . Hyperthyroidism     with pregnancy  . Hypertension    Past Surgical History  Procedure Laterality Date  . Cesarean section    . Tonsillectomy      as child   Family History  Problem Relation Age of Onset  . Diabetes     History  Substance Use Topics  . Smoking status: Current Every Day Smoker -- 0.50 packs/day    Types: Cigarettes    Last Attempt to Quit: 07/21/2011  . Smokeless tobacco: Never Used     Comment: 2-3 cigarettes a day  . Alcohol Use: No   OB History   Grav Para Term Preterm Abortions TAB SAB Ect Mult Living   2 2 2  0 0 0 0 0 0 2     Review of Systems   Musculoskeletal: Positive for arthralgias.  Skin: Negative for color change and wound.  Neurological: Negative for weakness and numbness.  All other systems reviewed and are negative.    Allergies  Aspirin  Home Medications   Current Outpatient Rx  Name  Route  Sig  Dispense  Refill  . acetaminophen (TYLENOL) 500 MG tablet   Oral   Take 1,000 mg by mouth every 6 (six) hours as needed. For pain or fever         . ibuprofen (ADVIL,MOTRIN) 200 MG tablet   Oral   Take 800 mg by mouth every 8 (eight) hours as needed. For pain         . methyldopa (ALDOMET) 500 MG tablet      take 1 tablet by mouth twice a day   60 tablet   0     Pt needs office visit for future refills   . pimecrolimus (ELIDEL) 1 % cream   Topical   Apply 1 application topically as needed. For eczema          Triage Vitals: BP 128/75  Pulse 93  Temp(Src) 98.1 F (36.7 C) (Oral)  Resp 14  Ht 5\' 4"  (1.626 m)  Wt 190 lb (86.183 kg)  BMI 32.60  kg/m2  SpO2 100%  LMP 05/15/2013  Physical Exam  Nursing note and vitals reviewed. Constitutional: She is oriented to person, place, and time. She appears well-developed and well-nourished. No distress.  HENT:  Head: Normocephalic and atraumatic.  Eyes: EOM are normal.  Neck: Neck supple. No tracheal deviation present.  Cardiovascular: Normal rate.   Pulmonary/Chest: Effort normal. No respiratory distress.  Musculoskeletal: Normal range of motion.       Left elbow: Normal.       Left wrist: She exhibits tenderness and bony tenderness. She exhibits no swelling, no effusion, no crepitus, no deformity and no laceration.       Left forearm: Normal.       Arms:      Left hand: Normal.  Point tenderness ulnar aspect of left wrist, otherwise non-tender. Full active ROM of all digits.  LUE:  Strength 5/5, sensation intact, distal pulses intact.      Neurological: She is alert and oriented to person, place, and time.  Skin: Skin is warm and dry. She is not  diaphoretic.  Psychiatric: She has a normal mood and affect. Her behavior is normal.    ED Course  Procedures (including critical care time)  DIAGNOSTIC STUDIES: Oxygen Saturation is 100% on room air, normal by my interpretation.    COORDINATION OF CARE:  8:11 PM- Discussed treatment plan with patient, which includes imaging, and the patient agreed to the plan.   Labs Review Labs Reviewed - No data to display Imaging Review Dg Wrist Complete Left  05/16/2013   CLINICAL DATA:  Hyperexpanded wrist yesterday, now with posterior wrist pain and swelling  EXAM: LEFT WRIST - COMPLETE 3+ VIEW  COMPARISON:  None.  FINDINGS: No fracture dislocation. Joint spaces are preserved. No erosions. No evidence of chondrocalcinosis. There is mild diffuse soft tissue swelling about the wrist. No definite displacement of the pronator quadratus fat pad.  IMPRESSION: Mild diffuse soft tissue swelling about the wrist without associated fracture. If the patient has pain referable to the anatomic snuff box, splinting and a follow-up radiograph in 10 to 14 days is recommended to evaluate for occult scaphoid fracture.   Electronically Signed   By: Simonne Come M.D.   On: 05/16/2013 20:57    EKG Interpretation   None      Pt denies breastfeeding  MDM   1. Left wrist pain     Pt with hyperextension to wrist yesterday.  Point tenderness to ulnar aspect of wrist.  Xray negative.  Likely sprained.  Neurovascularly intact.  Discussed result, findings, treatment, and follow up  with patient.  Pt given return precautions.  Pt verbalizes understanding and agrees with plan.      I personally performed the services described in this documentation, which was scribed in my presence. The recorded information has been reviewed and is accurate.     Trixie Dredge, PA-C 05/16/13 2116

## 2013-12-15 ENCOUNTER — Encounter (HOSPITAL_COMMUNITY): Payer: Self-pay | Admitting: Emergency Medicine

## 2013-12-15 ENCOUNTER — Emergency Department (HOSPITAL_COMMUNITY)
Admission: EM | Admit: 2013-12-15 | Discharge: 2013-12-15 | Disposition: A | Discharge status: Home / Self Care | Payer: Medicaid Other | Attending: Emergency Medicine | Admitting: Emergency Medicine

## 2013-12-15 ENCOUNTER — Emergency Department (HOSPITAL_COMMUNITY): Payer: Medicaid Other

## 2013-12-15 DIAGNOSIS — Z8639 Personal history of other endocrine, nutritional and metabolic disease: Secondary | ICD-10-CM | POA: Insufficient documentation

## 2013-12-15 DIAGNOSIS — F17200 Nicotine dependence, unspecified, uncomplicated: Secondary | ICD-10-CM | POA: Insufficient documentation

## 2013-12-15 DIAGNOSIS — I10 Essential (primary) hypertension: Secondary | ICD-10-CM | POA: Insufficient documentation

## 2013-12-15 DIAGNOSIS — S63502A Unspecified sprain of left wrist, initial encounter: Secondary | ICD-10-CM

## 2013-12-15 DIAGNOSIS — Y9389 Activity, other specified: Secondary | ICD-10-CM | POA: Insufficient documentation

## 2013-12-15 DIAGNOSIS — Z862 Personal history of diseases of the blood and blood-forming organs and certain disorders involving the immune mechanism: Secondary | ICD-10-CM | POA: Insufficient documentation

## 2013-12-15 DIAGNOSIS — X500XXA Overexertion from strenuous movement or load, initial encounter: Secondary | ICD-10-CM | POA: Insufficient documentation

## 2013-12-15 DIAGNOSIS — Z791 Long term (current) use of non-steroidal anti-inflammatories (NSAID): Secondary | ICD-10-CM | POA: Insufficient documentation

## 2013-12-15 DIAGNOSIS — Z8719 Personal history of other diseases of the digestive system: Secondary | ICD-10-CM | POA: Insufficient documentation

## 2013-12-15 DIAGNOSIS — Y9229 Other specified public building as the place of occurrence of the external cause: Secondary | ICD-10-CM | POA: Insufficient documentation

## 2013-12-15 DIAGNOSIS — S63509A Unspecified sprain of unspecified wrist, initial encounter: Secondary | ICD-10-CM | POA: Insufficient documentation

## 2013-12-15 MED ORDER — IBUPROFEN 800 MG PO TABS: 800.0000 mg | ORAL_TABLET | Freq: Three times a day (TID) | ORAL | Status: DC

## 2013-12-15 MED ORDER — IBUPROFEN 800 MG PO TABS
800.0000 mg | ORAL_TABLET | Freq: Once | ORAL | Status: AC
  Administered 2013-12-15: 800 mg via ORAL
  Filled 2013-12-15: qty 1

## 2013-12-15 NOTE — ED Provider Notes (Signed)
CSN: [id number]     Arrival date & time 12/15/13  2114 History   First MD Initiated Contact with Patient 12/15/13 2126     Chief Complaint  Patient presents with  . Wrist Pain    left   HPI  History provided by the patient. The patient is a 38 year old female presenting with left wrist pain and injury. [facility] and while she was attempting to lift a 55 pound bag of dog food she reports feeling a popping sensation in her left wrist followed by pain. She was able to apply ice to the area and some of her pain has improved but she still feels a "pressure" over the dorsal aspect. She denies any reduced range of motion. No weakness in the fingers or hand. No numbness or tingling. No other injuries or complaints.   Past Medical History  Diagnosis Date  . IBS (irritable bowel syndrome)   . Anemia   . Hyperthyroidism     with pregnancy  . Hypertension    Past Surgical History  Procedure Laterality Date  . Cesarean section    . Tonsillectomy      as child   Family History  Problem Relation Age of Onset  . Diabetes     History  Substance Use Topics  . Smoking status: Current Every Day Smoker -- 0.33 packs/day    Types: Cigarettes  . Smokeless tobacco: Never Used     Comment: 2-3 cigarettes a day  . Alcohol Use: No   OB History   Grav Para Term Preterm Abortions TAB SAB Ect Mult Living   2 2 2  0 0 0 0 0 0 2     Review of Systems  All other systems reviewed and are negative.     Allergies  Aspirin  Home Medications   Prior to Admission medications   Medication Sig Start Date End Date Taking? Authorizing Provider  acetaminophen (TYLENOL) 500 MG tablet Take 1,000 mg by mouth every 6 (six) hours as needed. For pain or fever    Historical Provider, MD  HYDROcodone-acetaminophen (NORCO/VICODIN) 5-325 MG per tablet Take 1 tablet by mouth every 4 (four) hours as needed for moderate pain or severe pain. 05/16/13   Trixie DredgeEmily West, PA-C  ibuprofen  (ADVIL,MOTRIN) 200 MG tablet Take 800 mg by mouth every 8 (eight) hours as needed. For pain    Historical Provider, MD  ibuprofen (ADVIL,MOTRIN) 600 MG tablet Take 1 tablet (600 mg total) by mouth every 8 (eight) hours as needed for mild pain or moderate pain. 05/16/13   Trixie DredgeEmily West, PA-C  ibuprofen (ADVIL,MOTRIN) 800 MG tablet Take 1 tablet (800 mg total) by mouth 3 (three) times daily. 12/15/13   Phill MutterPeter S Dammen, PA-C   BP 134/84  Pulse 90  Temp(Src) 98.7 F (37.1 C) (Oral)  Resp 18  Ht 5\' 4"  (1.626 m)  Wt 180 lb (81.647 kg)  BMI 30.88 kg/m2  SpO2 100%  LMP 11/30/2013 Physical Exam  Nursing note and vitals reviewed. Constitutional: She is oriented to person, place, and time. She appears well-developed and well-nourished. No distress.  HENT:  Head: Normocephalic.  Cardiovascular: Normal rate and regular rhythm.   Pulmonary/Chest: Effort normal and breath sounds normal.  Abdominal: Soft.  Musculoskeletal: Normal range of motion. She exhibits tenderness. She exhibits no edema.  Mild tenderness over the dorsal left wrist. There is no gross deformity. No significant pain over the distal radius or over area. No anatomical snuffbox tenderness. Normal grip strength.  Normal sensation and capillary refill in the fingers.  Neurological: She is alert and oriented to person, place, and time.  Skin: Skin is warm and dry. No rash noted.  Psychiatric: She has a normal mood and affect. Her behavior is normal.    ED Course  Procedures   COORDINATION OF CARE:  Nursing notes reviewed. Vital signs reviewed. Initial pt interview and examination performed.   Filed Vitals:   12/15/13 2130  BP: 134/84  Pulse: 90  Temp: 98.7 F (37.1 C)  TempSrc: Oral  Resp: 18  Height: 5\' 4"  (1.626 m)  Weight: 180 lb (81.647 kg)  SpO2: 100%    9:37 PM-patient seen and evaluated. She appears well. No significant pains. No gross deformities.  X-rays reviewed.  No signs of acute fracture or dislocation.  There  is slight abnormality of normal angle between scaphoid and lunate.  No significant change from similar left wrist x-ray one year ago. At this time will place pt in wrist splint and have her follow up with PCP.  She agrees.  Treatment plan initiated: Medications  ibuprofen (ADVIL,MOTRIN) tablet 800 mg (not administered)      Imaging Review Dg Wrist Complete Left  12/15/2013   CLINICAL DATA:  Lifting injury of the left wrist.  Pain.  EXAM: LEFT WRIST - COMPLETE 3+ VIEW  COMPARISON:  05/16/2013  FINDINGS: No fracture or acute bony findings. On the lateral projection, the scapholunate angle is approximately 15 degrees.  IMPRESSION: 1. Reduced scapholunate angle, raising the possibility of volar intercalated segmental instability of the carpus.   Electronically Signed   By: Herbie BaltimoreWalt  Liebkemann M.D.   On: 12/15/2013 21:55     MDM   Final diagnoses:  Left wrist sprain        Angus Sellereter S Dammen, PA-C 12/15/13 2224

## 2013-12-15 NOTE — Discharge Instructions (Signed)
Your x-ray did not show any broken bones or other concerning injury. At this time your providers feel that you have a minor wrist sprain. Use rest, ice, compression elevation to reduce pain and swelling of your wrist and hand. Followup with your primary care provider for continued evaluation and treatment.   Wrist Sprain  A sprain is an injury in which a ligament that maintains the proper alignment of a joint is partially or completely torn. The ligaments of the wrist are susceptible to sprains. Sprains are classified into three categories. Grade 1 sprains cause pain, but the tendon is not lengthened. Grade 2 sprains include a lengthened ligament because the ligament is stretched or partially ruptured. With grade 2 sprains there is still function, although the function may be diminished. Grade 3 sprains are characterized by a complete tear of the tendon or muscle, and function is usually impaired. SYMPTOMS   Pain tenderness, inflammation, and/or bruising (contusion) of the injury.  A "pop" or tear felt and/or heard at the time of injury.  Decreased wrist function. CAUSES  A wrist sprain occurs when a force is placed on one or more ligaments that is greater than it/they can withstand. Common mechanisms of injury include:  Catching a ball with you hands.  Repetitive and/ or strenuous extension or flexion of the wrist. RISK INCREASES WITH:  Previous wrist injury.  Contact sports (boxing or wrestling).  Activities in which falling is common.  Poor strength and flexibility.  Improperly fitted or padded protective equipment. PREVENTION  Warm up and stretch properly before activity.  Allow for adequate recovery between workouts.  Maintain physical fitness:  Strength, flexibility, and endurance.  Cardiovascular fitness.  Protect the wrist joint by limiting its motion with the use of taping, braces, or splints.  Protect the wrist after injury for 6 to 12 months. PROGNOSIS  The  prognosis for wrist sprains depends on the degree of injury. Grade 1 sprains require 2 to 6 weeks of treatment. Grade 2 sprains require 6 to 8 weeks of treatment, and grade 3 sprains require up to 12 weeks.  RELATED COMPLICATIONS   Prolonged healing time, if improperly treated or re-injured.  Recurrent symptoms that result in a chronic problem.  Injury to nearby structures (bone, cartilage, nerves, or tendons).  Arthritis of the wrist.  Inability to compete in athletics at a high level.  Wrist stiffness or weakness.  Progression to a complete rupture of the ligament. TREATMENT  Treatment initially involves resting from any activities that aggravate the symptoms, and the use of ice and medications to help reduce pain and inflammation. Your caregiver may recommend immobilizing the wrist for a period of time in order to reduce stress on the ligament and allow for healing. After immobilization it is important to perform strengthening and stretching exercises to help regain strength and a full range of motion. These exercises may be completed at home or with a therapist. Surgery is not usually required for wrist sprains, unless the ligament has been ruptured (grade 3 sprain). MEDICATION   If pain medication is necessary, then nonsteroidal anti-inflammatory medications, such as aspirin and ibuprofen, or other minor pain relievers, such as acetaminophen, are often recommended.  Do not take pain medication for 7 days before surgery.  Prescription pain relievers may be given if deemed necessary by your caregiver. Use only as directed and only as much as you need. HEAT AND COLD  Cold treatment (icing) relieves pain and reduces inflammation. Cold treatment should be applied for 10  to 15 minutes every 2 to 3 hours for inflammation and pain and immediately after any activity that aggravates your symptoms. Use ice packs or massage the area with a piece of ice (ice massage).  Heat treatment may be used  prior to performing the stretching and strengthening activities prescribed by your caregiver, physical therapist, or athletic trainer. Use a heat pack or soak your injury in warm water. SEEK MEDICAL CARE IF:  Treatment seems to offer no benefit, or the condition worsens.  Any medications produce adverse side effects.  Document Released: 06/18/2005 Document Revised: 09/10/2011 Document Reviewed: 09/30/2008 [facility] Patient Information 2014 [employer] [place].

## 2013-12-15 NOTE — ED Notes (Signed)
Ortho tech at bedside 

## 2013-12-15 NOTE — ED Notes (Addendum)
Patient states she hurt her wrist while attempting to pick up a 55 pound bag of [facility] food [facility] [employer]. Patient states her left pinky finger was malaligned, but has now returned to normal position. Patient complain of increased pain when flexing her wrist and when making a fist. Patient states pain radiates into her forearm. Patient has been using ice at home.

## 2013-12-17 NOTE — ED Provider Notes (Signed)
Medical screening examination/treatment/procedure(s) were performed by non-physician practitioner and as supervising physician I was immediately available for consultation/collaboration.   EKG Interpretation None        Kathleen M McManus, DO 12/17/13 1740 

## 2014-04-19 ENCOUNTER — Other Ambulatory Visit (INDEPENDENT_AMBULATORY_CARE_PROVIDER_SITE_OTHER): Payer: Medicaid Other

## 2014-04-19 DIAGNOSIS — Z0000 Encounter for general adult medical examination without abnormal findings: Secondary | ICD-10-CM

## 2014-04-19 LAB — TSH: TSH: 0.62 u[IU]/mL (ref 0.35–4.50)

## 2014-04-19 LAB — POCT URINALYSIS DIPSTICK
Bilirubin, UA: NEGATIVE
Glucose, UA: NEGATIVE
KETONES UA: NEGATIVE
Leukocytes, UA: NEGATIVE
Nitrite, UA: NEGATIVE
PH UA: 5.5
PROTEIN UA: NEGATIVE
SPEC GRAV UA: 1.02
Urobilinogen, UA: 0.2

## 2014-04-19 LAB — LIPID PANEL
CHOL/HDL RATIO: 3
CHOLESTEROL: 92 mg/dL (ref 0–200)
HDL: 28 mg/dL — ABNORMAL LOW (ref >39.00)
LDL CALC: 46 mg/dL (ref 0–99)
NONHDL: 64
Triglycerides: 90 mg/dL (ref 0.0–149.0)
VLDL: 18 mg/dL (ref 0.0–40.0)

## 2014-04-19 LAB — CBC WITH DIFFERENTIAL/PLATELET
Basophils Absolute: 0 10*3/uL (ref 0.0–0.1)
Basophils Relative: 0.5 % (ref 0.0–3.0)
Eosinophils Absolute: 0.2 10*3/uL (ref 0.0–0.7)
Eosinophils Relative: 2.9 % (ref 0.0–5.0)
HEMATOCRIT: 36 % (ref 36.0–46.0)
Hemoglobin: 11.4 g/dL — ABNORMAL LOW (ref 12.0–15.0)
LYMPHS ABS: 1.6 10*3/uL (ref 0.7–4.0)
Lymphocytes Relative: 29.9 % (ref 12.0–46.0)
MCHC: 31.7 g/dL (ref 30.0–36.0)
MCV: 85.8 fl (ref 78.0–100.0)
MONOS PCT: 10.2 % (ref 3.0–12.0)
Monocytes Absolute: 0.5 10*3/uL (ref 0.1–1.0)
Neutro Abs: 3 10*3/uL (ref 1.4–7.7)
Neutrophils Relative %: 56.5 % (ref 43.0–77.0)
PLATELETS: 386 10*3/uL (ref 150.0–400.0)
RBC: 4.19 Mil/uL (ref 3.87–5.11)
RDW: 14.1 % (ref 11.5–15.5)
WBC: 5.4 10*3/uL (ref 4.0–10.5)

## 2014-04-19 LAB — BASIC METABOLIC PANEL
BUN: 8 mg/dL (ref 6–23)
CO2: 27 meq/L (ref 19–32)
Calcium: 8.9 mg/dL (ref 8.4–10.5)
Chloride: 106 mEq/L (ref 96–112)
Creatinine, Ser: 0.9 mg/dL (ref 0.4–1.2)
GFR: 92.22 mL/min (ref >60.00)
GLUCOSE: 91 mg/dL (ref 70–99)
POTASSIUM: 4.4 meq/L (ref 3.5–5.1)
Sodium: 140 mEq/L (ref 135–145)

## 2014-04-19 LAB — HEPATIC FUNCTION PANEL
ALK PHOS: 70 U/L (ref 39–117)
ALT: 8 U/L (ref 0–35)
AST: 19 U/L (ref 0–37)
Albumin: 3.1 g/dL — ABNORMAL LOW (ref 3.5–5.2)
BILIRUBIN DIRECT: 0.1 mg/dL (ref 0.0–0.3)
TOTAL PROTEIN: 7.6 g/dL (ref 6.0–8.3)
Total Bilirubin: 0.5 mg/dL (ref 0.2–1.2)

## 2014-04-26 ENCOUNTER — Encounter: Payer: Self-pay | Admitting: Family Medicine

## 2014-04-26 ENCOUNTER — Other Ambulatory Visit (HOSPITAL_COMMUNITY)
Admission: RE | Admit: 2014-04-26 | Discharge: 2014-04-26 | Disposition: A | Discharge status: Home / Self Care | Payer: Medicaid Other | Source: Ambulatory Visit | Attending: Family Medicine | Admitting: Family Medicine

## 2014-04-26 ENCOUNTER — Ambulatory Visit (INDEPENDENT_AMBULATORY_CARE_PROVIDER_SITE_OTHER): Payer: Medicaid Other | Admitting: Family Medicine

## 2014-04-26 VITALS — BP 115/79 | HR 103 | Temp 99.1°F | Ht 64.0 in | Wt 194.0 lb

## 2014-04-26 DIAGNOSIS — Z23 Encounter for immunization: Secondary | ICD-10-CM

## 2014-04-26 DIAGNOSIS — Z0000 Encounter for general adult medical examination without abnormal findings: Secondary | ICD-10-CM

## 2014-04-26 DIAGNOSIS — Z01419 Encounter for gynecological examination (general) (routine) without abnormal findings: Secondary | ICD-10-CM | POA: Diagnosis present

## 2014-04-26 MED ORDER — ESCITALOPRAM OXALATE 10 MG PO TABS: 10.0000 mg | ORAL_TABLET | Freq: Every day | ORAL | Status: DC

## 2014-04-26 NOTE — Progress Notes (Signed)
Pre visit review using our clinic review tool, if applicable. No additional management support is needed unless otherwise documented below in the visit note. 

## 2014-04-26 NOTE — Progress Notes (Signed)
Subjective:    Patient ID: Shelby Harris, female    DOB: 24-Jun-1976, 38 y.o.   MRN: [id number]  HPI 38 yr old female for a cpx. She feels well but asks about something for anxiety. She gets very nervous over many things that occur during the course of the day, she feels shaky and she gets tearful.    Review of Systems  Constitutional: Negative.  Negative for fever, diaphoresis, activity change, appetite change, fatigue and unexpected weight change.  HENT: Negative.  Negative for congestion, ear pain, hearing loss, nosebleeds, sore throat, tinnitus, trouble swallowing and voice change.   Eyes: Negative.  Negative for photophobia, pain, discharge, redness and visual disturbance.  Respiratory: Negative.  Negative for apnea, cough, choking, chest tightness, shortness of breath, wheezing and stridor.   Cardiovascular: Negative.  Negative for chest pain, palpitations and leg swelling.  Gastrointestinal: Negative.  Negative for nausea, vomiting, abdominal pain, diarrhea, constipation, blood in stool, abdominal distention and rectal pain.  Genitourinary: Negative.  Negative for dysuria, urgency, frequency, hematuria, flank pain, vaginal bleeding, vaginal discharge, enuresis, difficulty urinating, vaginal pain and menstrual problem.  Musculoskeletal: Negative.  Negative for arthralgias, back pain, gait problem, joint swelling, myalgias, neck pain and neck stiffness.  Skin: Negative.  Negative for color change, pallor, rash and wound.  Neurological: Negative.  Negative for dizziness, tremors, seizures, syncope, speech difficulty, weakness, light-headedness, numbness and headaches.  Hematological: Negative for adenopathy. Does not bruise/bleed easily.  Psychiatric/Behavioral: Negative for suicidal ideas, hallucinations, behavioral problems, confusion, sleep disturbance, self-injury, dysphoric mood, decreased concentration and agitation. The patient is nervous/anxious.        Objective:   Physical  Exam  Constitutional: She appears well-developed and well-nourished. No distress.  HENT:  Head: Normocephalic and atraumatic.  Right Ear: External ear normal.  Left Ear: External ear normal.  Nose: Nose normal.  Mouth/Throat: Oropharynx is clear and moist. No oropharyngeal exudate.  Eyes: Conjunctivae and EOM are normal. Pupils are equal, round, and reactive to light. Right eye exhibits no discharge. Left eye exhibits no discharge. No scleral icterus.  Neck: Normal range of motion. Neck supple. No JVD present. No thyromegaly present.  Cardiovascular: Normal rate, regular rhythm, normal heart sounds and intact distal pulses.  Exam reveals no gallop and no friction rub.   No murmur heard. Pulmonary/Chest: Effort normal and breath sounds normal. No stridor. No respiratory distress. She has no wheezes. She has no rales. She exhibits no tenderness.  Abdominal: Soft. Normal appearance and bowel sounds are normal. She exhibits no distension, no abdominal bruit, no ascites and no mass. There is no hepatosplenomegaly. There is no tenderness. There is no rigidity, no rebound and no guarding. No hernia.  Genitourinary: Rectum normal, vagina normal and uterus normal. No breast swelling, tenderness, discharge or bleeding. Cervix exhibits no motion tenderness, no discharge and no friability. Right adnexum displays no mass, no tenderness and no fullness. Left adnexum displays no mass, no tenderness and no fullness. No erythema, tenderness or bleeding around the vagina. No vaginal discharge found.  Musculoskeletal: Normal range of motion. She exhibits no edema and no tenderness.  Lymphadenopathy:    She has no cervical adenopathy.  Neurological: She is alert. She has normal reflexes. No cranial nerve deficit. She exhibits normal muscle tone. Coordination normal.  Skin: Skin is warm and dry. No rash noted. She is not diaphoretic. No erythema. No pallor.  Psychiatric: She has a normal mood and affect. Her behavior  is normal. Judgment and thought content normal.  Assessment & Plan:  Well exam. Try Lexapro 10 mg daily.

## 2014-04-27 ENCOUNTER — Telehealth: Payer: Self-pay | Admitting: Family Medicine

## 2014-04-27 NOTE — Telephone Encounter (Signed)
emmi emailed °

## 2014-04-28 LAB — CYTOLOGY - PAP

## 2014-04-29 MED ORDER — METRONIDAZOLE 500 MG PO TABS: 500.0000 mg | ORAL_TABLET | Freq: Three times a day (TID) | ORAL | Status: DC

## 2014-04-29 NOTE — Addendum Note (Signed)
Addended by: Aniceto BossNIMMONS, Jahfari Ambers A on: 04/29/2014 12:29 PM   Modules accepted: Orders

## 2014-05-03 ENCOUNTER — Encounter: Payer: Self-pay | Admitting: Family Medicine

## 2017-03-21 ENCOUNTER — Encounter: Payer: Self-pay | Admitting: Family Medicine

## 2017-12-04 ENCOUNTER — Emergency Department (HOSPITAL_COMMUNITY)
Admission: EM | Admit: 2017-12-04 | Discharge: 2017-12-04 | Disposition: A | Discharge status: Home / Self Care | Payer: Medicaid Other | Attending: Emergency Medicine | Admitting: Emergency Medicine

## 2017-12-04 ENCOUNTER — Emergency Department (HOSPITAL_COMMUNITY): Payer: Medicaid Other

## 2017-12-04 ENCOUNTER — Encounter (HOSPITAL_COMMUNITY): Payer: Self-pay | Admitting: Family Medicine

## 2017-12-04 ENCOUNTER — Emergency Department (HOSPITAL_COMMUNITY): Admission: EM | Admit: 2017-12-04 | Discharge: 2017-12-04 | Discharge status: Against Medical Advice | Payer: Self-pay

## 2017-12-04 DIAGNOSIS — Z79899 Other long term (current) drug therapy: Secondary | ICD-10-CM | POA: Insufficient documentation

## 2017-12-04 DIAGNOSIS — Y9301 Activity, walking, marching and hiking: Secondary | ICD-10-CM | POA: Insufficient documentation

## 2017-12-04 DIAGNOSIS — W19XXXA Unspecified fall, initial encounter: Secondary | ICD-10-CM

## 2017-12-04 DIAGNOSIS — S8002XA Contusion of left knee, initial encounter: Secondary | ICD-10-CM | POA: Insufficient documentation

## 2017-12-04 DIAGNOSIS — Y929 Unspecified place or not applicable: Secondary | ICD-10-CM | POA: Insufficient documentation

## 2017-12-04 DIAGNOSIS — S93602A Unspecified sprain of left foot, initial encounter: Secondary | ICD-10-CM

## 2017-12-04 DIAGNOSIS — W010XXA Fall on same level from slipping, tripping and stumbling without subsequent striking against object, initial encounter: Secondary | ICD-10-CM | POA: Insufficient documentation

## 2017-12-04 DIAGNOSIS — I10 Essential (primary) hypertension: Secondary | ICD-10-CM | POA: Insufficient documentation

## 2017-12-04 DIAGNOSIS — F17210 Nicotine dependence, cigarettes, uncomplicated: Secondary | ICD-10-CM | POA: Insufficient documentation

## 2017-12-04 DIAGNOSIS — Y999 Unspecified external cause status: Secondary | ICD-10-CM | POA: Insufficient documentation

## 2017-12-04 MED ORDER — HYDROCODONE-ACETAMINOPHEN 5-325 MG PO TABS
1.0000 | ORAL_TABLET | Freq: Once | ORAL | Status: AC
  Administered 2017-12-04: 1 via ORAL
  Filled 2017-12-04: qty 1

## 2017-12-04 MED ORDER — TRAMADOL HCL 50 MG PO TABS: 50.0000 mg | ORAL_TABLET | Freq: Four times a day (QID) | ORAL | 0 refills | Status: DC | PRN

## 2017-12-04 NOTE — ED Triage Notes (Signed)
Patient reports last night fell on concrete underneath her carport. She reports landed on her right knee and her leg went back. Patient reports her knee is swollen and denies bruising to the thigh. Patient denies taking any medication.

## 2017-12-04 NOTE — ED Provider Notes (Signed)
[facility]-EMERGENCY DEPT Provider Note   CSN: [id number] Arrival date & time: 12/04/17  1711     History   Chief Complaint Chief Complaint  Patient presents with  . Fall    HPI Shelby Harris is a 42 y.o. female who presents to the ED with left knee pain. Patient reports she slipped and fell in water on the cement carport last night. All her weight went on the left knee. Today she reports pain and swelling to the knee. She denies head injury or LOC. Patient radiates to the left foot and hip. The pain increases with walking and movement.   HPI  Past Medical History:  Diagnosis Date  . Anemia   . Hypertension   . Hyperthyroidism    with pregnancy  . IBS (irritable bowel syndrome)     Patient Active Problem List   Diagnosis Date Noted  . LACERATION, FINGER 01/09/2010  . VAGINITIS, BACTERIAL 07/08/2008    Past Surgical History:  Procedure Laterality Date  . CESAREAN SECTION    . TONSILLECTOMY     as child     OB History    Gravida  2   Para  2   Term  2   Preterm  0   AB  0   Living  2     SAB  0   TAB  0   Ectopic  0   Multiple  0   Live Births  2            Home Medications    Prior to Admission medications   Medication Sig Start Date End Date Taking? Authorizing Provider  cetirizine (ZYRTEC) 10 MG tablet Take 10 mg by mouth daily as needed for allergies.    [provider]  escitalopram (LEXAPRO) 10 MG tablet Take 1 tablet (10 mg total) by mouth daily. 04/26/14   Nelwyn Salisbury, MD  ibuprofen (ADVIL,MOTRIN) 200 MG tablet Take 800 mg by mouth every 8 (eight) hours as needed. For pain    [provider]  metroNIDAZOLE (FLAGYL) 500 MG tablet Take 1 tablet (500 mg total) by mouth 3 (three) times daily. 04/29/14   Nelwyn Salisbury, MD  Multiple Vitamins-Minerals (MULTIVITAMIN WITH MINERALS) tablet Take 1 tablet by mouth daily.    [provider]  traMADol (ULTRAM) 50 MG tablet Take 1 tablet (50  mg total) by mouth every 6 (six) hours as needed. 12/04/17   Janne Napoleon, NP  ferrous sulfate 300 (60 FE) MG/5ML syrup Take 300 mg by mouth daily.    09/12/11  [provider]    Family History Family History  Problem Relation Age of Onset  . Diabetes Unknown     Social History Social History   Tobacco Use  . Smoking status: Current Every Day Smoker    Packs/day: 1.00    Types: Cigarettes  . Smokeless tobacco: Never Used  . Tobacco comment: 1/2 or less  Substance Use Topics  . Alcohol use: No    Alcohol/week: 0.0 oz  . Drug use: No     Allergies   Patient has no known allergies.   Review of Systems Review of Systems  Musculoskeletal: Positive for arthralgias.  All other systems reviewed and are negative.    Physical Exam Updated Vital Signs BP (!) 139/96 (BP Location: Right Arm)   Pulse 95   Temp 99.2 F (37.3 C) (Oral)   Resp 18   Ht 5\' 4"  (1.626 m)  Wt 90.3 kg (199 lb)   LMP 11/19/2017   SpO2 100%   BMI 34.16 kg/m   Physical Exam  Constitutional: She appears well-developed and well-nourished. No distress.  HENT:  Head: Normocephalic and atraumatic.  Eyes: EOM are normal.  Neck: Normal range of motion. Neck supple.  Cardiovascular: Normal rate.  Pulmonary/Chest: Effort normal.  Musculoskeletal:       Left knee: She exhibits swelling. She exhibits no deformity, no laceration, no erythema, normal alignment and normal patellar mobility. Decreased range of motion: due to pain. Tenderness found.       Left foot: There is tenderness. There is normal range of motion, normal capillary refill, no deformity and no laceration.       Feet:  Pedal pulse 2+, left hip with mild tenderness with palpation and range of motion. Patient ambulatory without difficulty.  Neurological: She is alert.  Skin: Skin is warm and dry.  Psychiatric: She has a normal mood and affect. Her behavior is normal.  Nursing note and vitals reviewed.    ED Treatments / Results   Labs (all labs ordered are listed, but only abnormal results are displayed) Labs Reviewed - No data to display Radiology Dg Knee Complete 4 Views Left  Result Date: 12/04/2017 CLINICAL DATA:  Fall EXAM: LEFT KNEE - COMPLETE 4+ VIEW COMPARISON:  None. FINDINGS: No evidence of fracture, dislocation, or joint effusion. No evidence of arthropathy or other focal bone abnormality. Soft tissues are unremarkable. IMPRESSION: Negative. Electronically Signed   By: Charlett NoseKevin  Dover M.D.   On: 12/04/2017 19:15   Dg Foot Complete Left  Result Date: 12/04/2017 CLINICAL DATA:  Fall EXAM: LEFT FOOT - COMPLETE 3+ VIEW COMPARISON:  11/04/2004 FINDINGS: No acute bony abnormality. Specifically, no fracture, subluxation, or dislocation. Small plantar calcaneal spur. IMPRESSION: No acute bony abnormality. Electronically Signed   By: Charlett NoseKevin  Dover M.D.   On: 12/04/2017 19:14    Procedures Procedures (including critical care time)  Medications Ordered in ED Medications  HYDROcodone-acetaminophen (NORCO/VICODIN) 5-325 MG per tablet 1 tablet (1 tablet Oral Given 12/04/17 1905)     Initial Impression / Assessment and Plan / ED Course  I have reviewed the triage vital signs and the nursing notes. 42 y.o. female with left lower extremity pain s/p fall stable for d/c without fracture or dislocation noted on x-ray and no focal neuro deficits.   Final Clinical Impressions(s) / ED Diagnoses   Final diagnoses:  Fall, initial encounter  Contusion of left knee, initial encounter  Foot sprain, left, initial encounter    ED Discharge Orders        Ordered    traMADol (ULTRAM) 50 MG tablet  Every 6 hours PRN     12/04/17 1926       Kerrie Buffaloeese, Hope BarnhillM, NP 12/04/17 Serena [id number]    Jacalyn LefevreHaviland, Julie, MD 12/04/17 2013

## 2017-12-04 NOTE — Discharge Instructions (Signed)
Do not drive while taking the narcotic pain medication. You can still take tylenol and Advil with the narcotic. Follow up with your doctor. Return here as needed.

## 2018-05-08 ENCOUNTER — Other Ambulatory Visit: Payer: Self-pay

## 2018-05-08 ENCOUNTER — Encounter (HOSPITAL_COMMUNITY): Payer: Self-pay | Admitting: Emergency Medicine

## 2018-05-08 ENCOUNTER — Emergency Department (HOSPITAL_COMMUNITY)
Admission: EM | Admit: 2018-05-08 | Discharge: 2018-05-08 | Disposition: A | Discharge status: Home / Self Care | Payer: Medicaid Other | Attending: Emergency Medicine | Admitting: Emergency Medicine

## 2018-05-08 DIAGNOSIS — F17210 Nicotine dependence, cigarettes, uncomplicated: Secondary | ICD-10-CM | POA: Insufficient documentation

## 2018-05-08 DIAGNOSIS — Z79899 Other long term (current) drug therapy: Secondary | ICD-10-CM | POA: Insufficient documentation

## 2018-05-08 DIAGNOSIS — E0500 Thyrotoxicosis with diffuse goiter without thyrotoxic crisis or storm: Secondary | ICD-10-CM | POA: Insufficient documentation

## 2018-05-08 DIAGNOSIS — R112 Nausea with vomiting, unspecified: Secondary | ICD-10-CM | POA: Insufficient documentation

## 2018-05-08 DIAGNOSIS — I10 Essential (primary) hypertension: Secondary | ICD-10-CM | POA: Insufficient documentation

## 2018-05-08 DIAGNOSIS — R197 Diarrhea, unspecified: Secondary | ICD-10-CM | POA: Insufficient documentation

## 2018-05-08 LAB — COMPREHENSIVE METABOLIC PANEL
ALBUMIN: 3.6 g/dL (ref 3.5–5.0)
ALK PHOS: 77 U/L (ref 38–126)
ALT: 14 U/L (ref 0–44)
AST: 20 U/L (ref 15–41)
Anion gap: 11 (ref 5–15)
BILIRUBIN TOTAL: 0.5 mg/dL (ref 0.3–1.2)
CALCIUM: 9 mg/dL (ref 8.9–10.3)
CO2: 21 mmol/L — ABNORMAL LOW (ref 22–32)
Chloride: 105 mmol/L (ref 98–111)
Creatinine, Ser: 0.87 mg/dL (ref 0.44–1.00)
GFR calc Af Amer: >60 mL/min (ref >60)
GLUCOSE: 87 mg/dL (ref 70–99)
Potassium: 3.8 mmol/L (ref 3.5–5.1)
Sodium: 137 mmol/L (ref 135–145)
TOTAL PROTEIN: 7.3 g/dL (ref 6.5–8.1)

## 2018-05-08 LAB — CBC WITH DIFFERENTIAL/PLATELET
Abs Immature Granulocytes: 0.02 10*3/uL (ref 0.00–0.07)
BASOS ABS: 0.1 10*3/uL (ref 0.0–0.1)
BASOS PCT: 1 %
EOS ABS: 0.1 10*3/uL (ref 0.0–0.5)
EOS PCT: 1 %
HEMATOCRIT: 40.6 % (ref 36.0–46.0)
Hemoglobin: 12.3 g/dL (ref 12.0–15.0)
Immature Granulocytes: 0 %
LYMPHS ABS: 1.1 10*3/uL (ref 0.7–4.0)
Lymphocytes Relative: 18 %
MCH: 26.9 pg (ref 26.0–34.0)
MCHC: 30.3 g/dL (ref 30.0–36.0)
MCV: 88.8 fL (ref 80.0–100.0)
Monocytes Absolute: 0.6 10*3/uL (ref 0.1–1.0)
Monocytes Relative: 10 %
NRBC: 0 % (ref 0.0–0.2)
Neutro Abs: 4.2 10*3/uL (ref 1.7–7.7)
Neutrophils Relative %: 70 %
Platelets: 447 10*3/uL — ABNORMAL HIGH (ref 150–400)
RBC: 4.57 MIL/uL (ref 3.87–5.11)
RDW: 13.5 % (ref 11.5–15.5)
WBC: 6.1 10*3/uL (ref 4.0–10.5)

## 2018-05-08 LAB — URINALYSIS, ROUTINE W REFLEX MICROSCOPIC
BILIRUBIN URINE: NEGATIVE
GLUCOSE, UA: NEGATIVE mg/dL
HGB URINE DIPSTICK: NEGATIVE
KETONES UR: NEGATIVE mg/dL
Leukocytes, UA: NEGATIVE
Nitrite: NEGATIVE
PH: 6 (ref 5.0–8.0)
Protein, ur: NEGATIVE mg/dL
Specific Gravity, Urine: 1.005 (ref 1.005–1.030)

## 2018-05-08 LAB — LIPASE, BLOOD: LIPASE: 24 U/L (ref 11–51)

## 2018-05-08 MED ORDER — LOPERAMIDE HCL 2 MG PO CAPS: 2.0000 mg | ORAL_CAPSULE | Freq: Every day | ORAL | 0 refills | Status: DC | PRN

## 2018-05-08 MED ORDER — SODIUM CHLORIDE 0.9 % IV BOLUS
1000.0000 mL | Freq: Once | INTRAVENOUS | Status: AC
  Administered 2018-05-08: 1000 mL via INTRAVENOUS

## 2018-05-08 MED ORDER — ONDANSETRON HCL 4 MG/2ML IJ SOLN
4.0000 mg | Freq: Once | INTRAMUSCULAR | Status: AC
  Administered 2018-05-08: 4 mg via INTRAVENOUS
  Filled 2018-05-08: qty 2

## 2018-05-08 MED ORDER — DICYCLOMINE HCL 20 MG PO TABS: 20.0000 mg | ORAL_TABLET | Freq: Two times a day (BID) | ORAL | 0 refills | Status: DC

## 2018-05-08 MED ORDER — DICYCLOMINE HCL 10 MG PO CAPS
10.0000 mg | ORAL_CAPSULE | Freq: Once | ORAL | Status: AC
  Administered 2018-05-08: 10 mg via ORAL
  Filled 2018-05-08: qty 1

## 2018-05-08 MED ORDER — ONDANSETRON HCL 4 MG PO TABS: 4.0000 mg | ORAL_TABLET | Freq: Three times a day (TID) | ORAL | 0 refills | Status: DC | PRN

## 2018-05-08 NOTE — ED Provider Notes (Signed)
[facility] EMERGENCY DEPARTMENT Provider Note   CSN: [id number] Arrival date & time: 05/08/18  1022     History   Chief Complaint Chief Complaint  Patient presents with  . Nausea  . Emesis  . Diarrhea    HPI Shelby Harris is a 42 y.o. female who presents the emergency department chief complaint of nausea vomiting and diarrhea.  Patient had onset of symptoms this past Monday.  She did eat [place] food but her son ate the same thing and did not get sick.  She has had multiple episodes of watery brown stool, nausea and nonbloody nonbilious vomitus up to 5 times daily.  She says that today she started feeling very weak and dizzy when she would stand up.  She denies vertiginous symptoms.  She has some mild abdominal cramping and feels increased peristaltic action in her abdomen.  She denies urinary symptoms, fevers, chills, recent foreign travel.  She has no contacts with similar symptoms.  HPI  Past Medical History:  Diagnosis Date  . Anemia   . Hypertension   . Hyperthyroidism    with pregnancy  . IBS (irritable bowel syndrome)     Patient Active Problem List   Diagnosis Date Noted  . LACERATION, FINGER 01/09/2010  . VAGINITIS, BACTERIAL 07/08/2008    Past Surgical History:  Procedure Laterality Date  . CESAREAN SECTION    . TONSILLECTOMY     as child     OB History    Gravida  2   Para  2   Term  2   Preterm  0   AB  0   Living  2     SAB  0   TAB  0   Ectopic  0   Multiple  0   Live Births  2            Home Medications    Prior to Admission medications   Medication Sig Start Date End Date Taking? Authorizing Provider  ibuprofen (ADVIL,MOTRIN) 200 MG tablet Take 800 mg by mouth every 8 (eight) hours as needed. For pain   Yes [provider]  Multiple Vitamins-Minerals (MULTIVITAMIN WITH MINERALS) tablet Take 1 tablet by mouth daily.   Yes [provider]  polyethylene glycol (MIRALAX / GLYCOLAX)  packet Take 17 g by mouth daily as needed for mild constipation.   Yes [provider]  dicyclomine (BENTYL) 20 MG tablet Take 1 tablet (20 mg total) by mouth 2 (two) times daily. 05/08/18   Laderrick Wilk, PA-C  escitalopram (LEXAPRO) 10 MG tablet Take 1 tablet (10 mg total) by mouth daily. Patient not taking: Reported on 05/08/2018 04/26/14   Nelwyn Salisbury, MD  loperamide (IMODIUM) 2 MG capsule Take 1-2 capsules (2-4 mg total) by mouth daily as needed for diarrhea or loose stools. 05/08/18   Amalio Loe, PA-C  ondansetron (ZOFRAN) 4 MG tablet Take 1 tablet (4 mg total) by mouth every 8 (eight) hours as needed for nausea or vomiting. 05/08/18   Patricie Geeslin, Cammy Copa, PA-C  traMADol (ULTRAM) 50 MG tablet Take 1 tablet (50 mg total) by mouth every 6 (six) hours as needed. Patient not taking: Reported on 05/08/2018 12/04/17   Janne Napoleon, NP  ferrous sulfate 300 (60 FE) MG/5ML syrup Take 300 mg by mouth daily.    09/12/11  [provider]    Family History Family History  Problem Relation Age of Onset  . Diabetes Unknown     Social  History Social History   Tobacco Use  . Smoking status: Current Every Day Smoker    Packs/day: 1.00    Types: Cigarettes  . Smokeless tobacco: Never Used  . Tobacco comment: 1/2 or less  Substance Use Topics  . Alcohol use: No  . Drug use: No     Allergies   Patient has no known allergies.   Review of Systems Review of Systems Ten systems reviewed and are negative for acute change, except as noted in the HPI.    Physical Exam Updated Vital Signs BP 126/88 (BP Location: Left Arm)   Pulse 82   Temp 99 F (37.2 C) (Oral)   Resp 12   Ht 5\' 3"  (1.6 m)   Wt 90.7 kg   SpO2 100%   BMI 35.43 kg/m   Physical Exam  Constitutional: She is oriented to person, place, and time. She appears well-developed and well-nourished. No distress.  HENT:  Head: Normocephalic and atraumatic.  Eyes: Conjunctivae are normal. No scleral icterus.    Neck: Normal range of motion.  Cardiovascular: Normal rate, regular rhythm and normal heart sounds. Exam reveals no gallop and no friction rub.  No murmur heard. Pulmonary/Chest: Effort normal and breath sounds normal. No respiratory distress.  Abdominal: Soft. Bowel sounds are normal. She exhibits no distension and no mass. There is no tenderness. There is no guarding.  Neurological: She is alert and oriented to person, place, and time.  Skin: Skin is warm and dry. She is not diaphoretic.  Psychiatric: Her behavior is normal.  Nursing note and vitals reviewed.    ED Treatments / Results  Labs (all labs ordered are listed, but only abnormal results are displayed) Labs Reviewed  CBC WITH DIFFERENTIAL/PLATELET - Abnormal; Notable for the following components:      Result Value   Platelets 447 (*)    All other components within normal limits  COMPREHENSIVE METABOLIC PANEL - Abnormal; Notable for the following components:   CO2 21 (*)    BUN <5 (*)    All other components within normal limits  LIPASE, BLOOD  URINALYSIS, ROUTINE W REFLEX MICROSCOPIC    EKG None  Radiology No results found.  Procedures Procedures (including critical care time)  Medications Ordered in ED Medications  ondansetron (ZOFRAN) injection 4 mg (4 mg Intravenous Given 05/08/18 1134)  sodium chloride 0.9 % bolus 1,000 mL (0 mLs Intravenous Stopped 05/08/18 1258)  dicyclomine (BENTYL) capsule 10 mg (10 mg Oral Given 05/08/18 1134)     Initial Impression / Assessment and Plan / ED Course  I have reviewed the triage vital signs and the nursing notes.  Pertinent labs & imaging results that were available during my care of the patient were reviewed by me and considered in my medical decision making (see chart for details).  Clinical Course as of May 09 940  Thu May 08, 2018  [id number] 42 year old female with 4 days of nausea vomiting and diarrhea. The differential diagnosis of diarrhea includes but is not  limited to Viral- norovirus/rotavirus; Bacterial-Campylobacter,Shigella, Salmonella, Escherichia coli, E. coli 0157:H7, Yersinia enterocolitica, Vibrio cholerae, Clostridium difficile. Parasitic- Giardia lamblia, Cryptosporidium,Entamoeba histolytica,Cyclospora, Microsporidium. Toxin- Staphylococcus aureus, Bacillus cereus. Noninfectious causes include GI Bleed, Appendicitis, Mesenteric Ischemia, Diverticulitis, Adrenal Crisis, Thyroid Storm, Toxicologic exposures, Antibiotic or drug-associated, inflammatory bowel disease.     [AH]    Clinical Course User Index [AH] Arthor Captain, PA-C    Patient with symptoms consistent with viral gastroenteritis.  Vitals are stable, no fever.  No signs  of dehydration, tolerating PO fluids > 6 oz.  Lungs are clear.  No focal abdominal pain, no concern for appendicitis, cholecystitis, pancreatitis, ruptured viscus, UTI, kidney stone, or any other abdominal etiology.  Supportive therapy indicated with return if symptoms worsen.  Patient counseled.  Final Clinical Impressions(s) / ED Diagnoses   Final diagnoses:  Nausea vomiting and diarrhea    ED Discharge Orders         Ordered    ondansetron (ZOFRAN) 4 MG tablet  Every 8 hours PRN     05/08/18 1255    dicyclomine (BENTYL) 20 MG tablet  2 times daily     05/08/18 1255    loperamide (IMODIUM) 2 MG capsule  Daily PRN,   Status:  Discontinued     05/08/18 1255    loperamide (IMODIUM) 2 MG capsule  Daily PRN     05/08/18 1257           Arthor Captain, PA-C 05/09/18 1610    Margarita Grizzle, MD 05/10/18 1528

## 2018-05-08 NOTE — Discharge Instructions (Addendum)
Contact a health care provider if: °· You cannot keep fluids down. °· Your symptoms get worse. °· You have new symptoms. °· You feel light-headed or dizzy. °· You have muscle cramps. °Get help right away if: °· You have chest pain. °· You feel extremely weak or you faint. °· You see blood in your vomit. °· Your vomit looks like coffee grounds. °· You have bloody or black stools or stools that look like tar. °· You have a severe headache, a stiff neck, or both. °· You have a rash. °· You have severe pain, cramping, or bloating in your abdomen. °· You have trouble breathing or you are breathing very quickly. °· Your heart is beating very quickly. °· Your skin feels cold and clammy. °· You feel confused. °· You have pain when you urinate. °· You have signs of dehydration, such as: °? Dark urine, very little urine, or no urine. °? Cracked lips. °? Dry mouth. °? Sunken eyes. °? Sleepiness. °? Weakness. ° °

## 2018-05-08 NOTE — ED Triage Notes (Signed)
Pt arrives to ED from home with complaints of N/V/D since Tuesday 11/5 after she ate [place] food. Pt reports that she has had generalized abdominal pain since. Pt placed in position of comfort with bed locked and lowered, call bell in reach.

## 2018-05-08 NOTE — ED Notes (Signed)
Patient verbalizes understanding of discharge instructions. Opportunity for questioning and answers were provided. Armband removed by staff, pt discharged from ED.  

## 2020-06-04 IMAGING — CR DG FOOT COMPLETE 3+V*L*
3 series · 3 of 3 positions shown · non-contrast
Comparison: 11/04/2004

CLINICAL DATA: Fall

EXAM:
LEFT FOOT - COMPLETE 3+ VIEW

[x foot ap left]
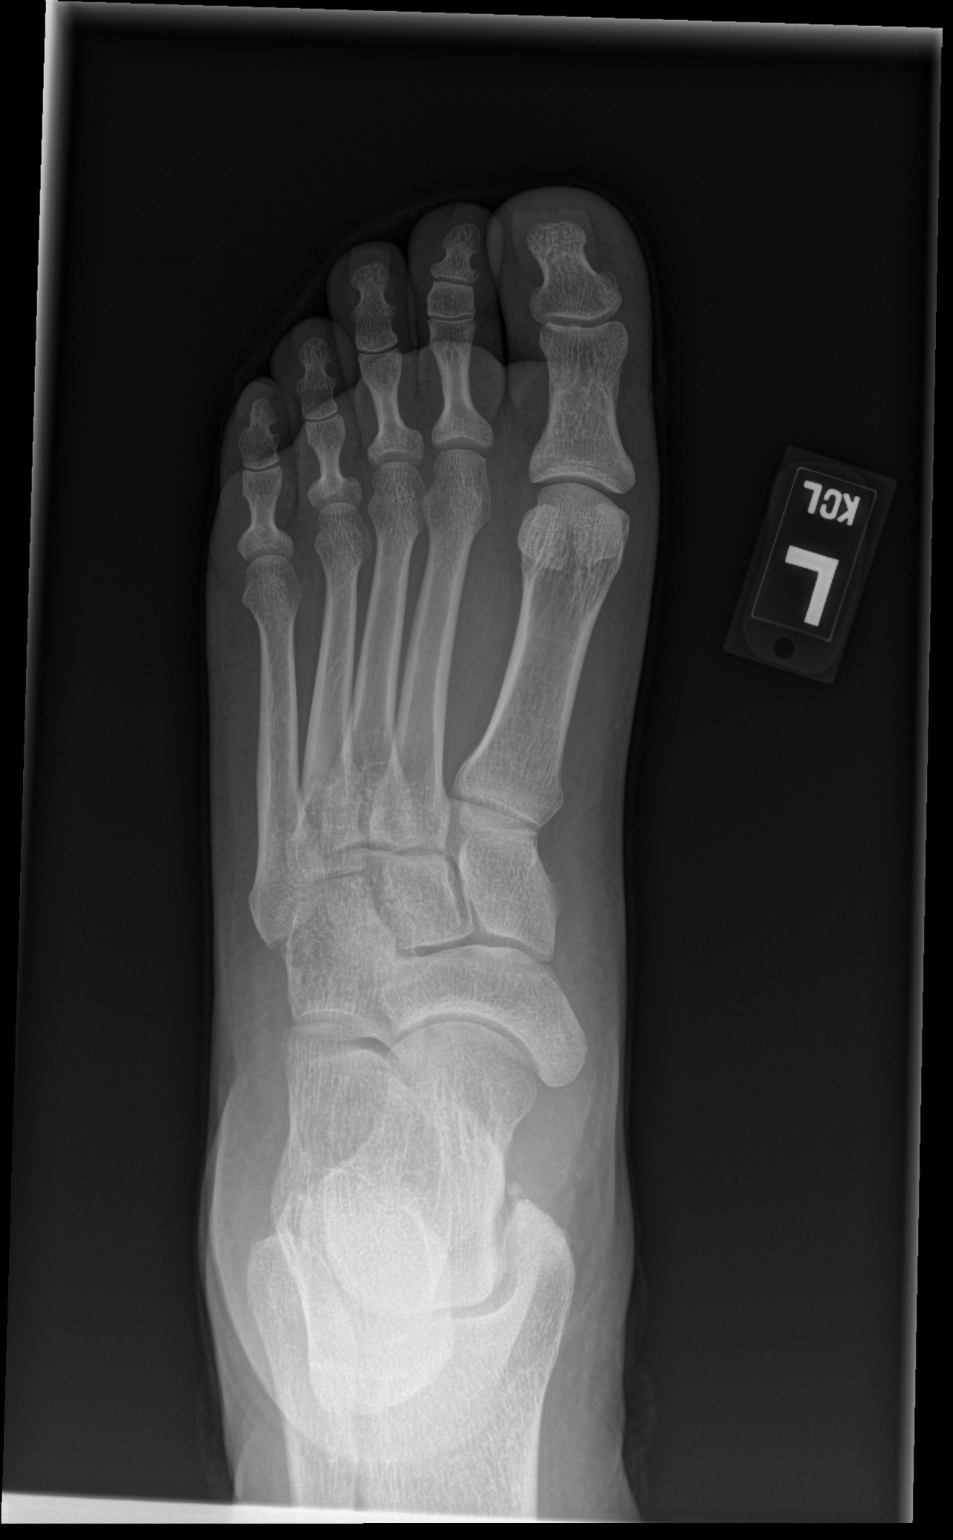

[x foot obl left]
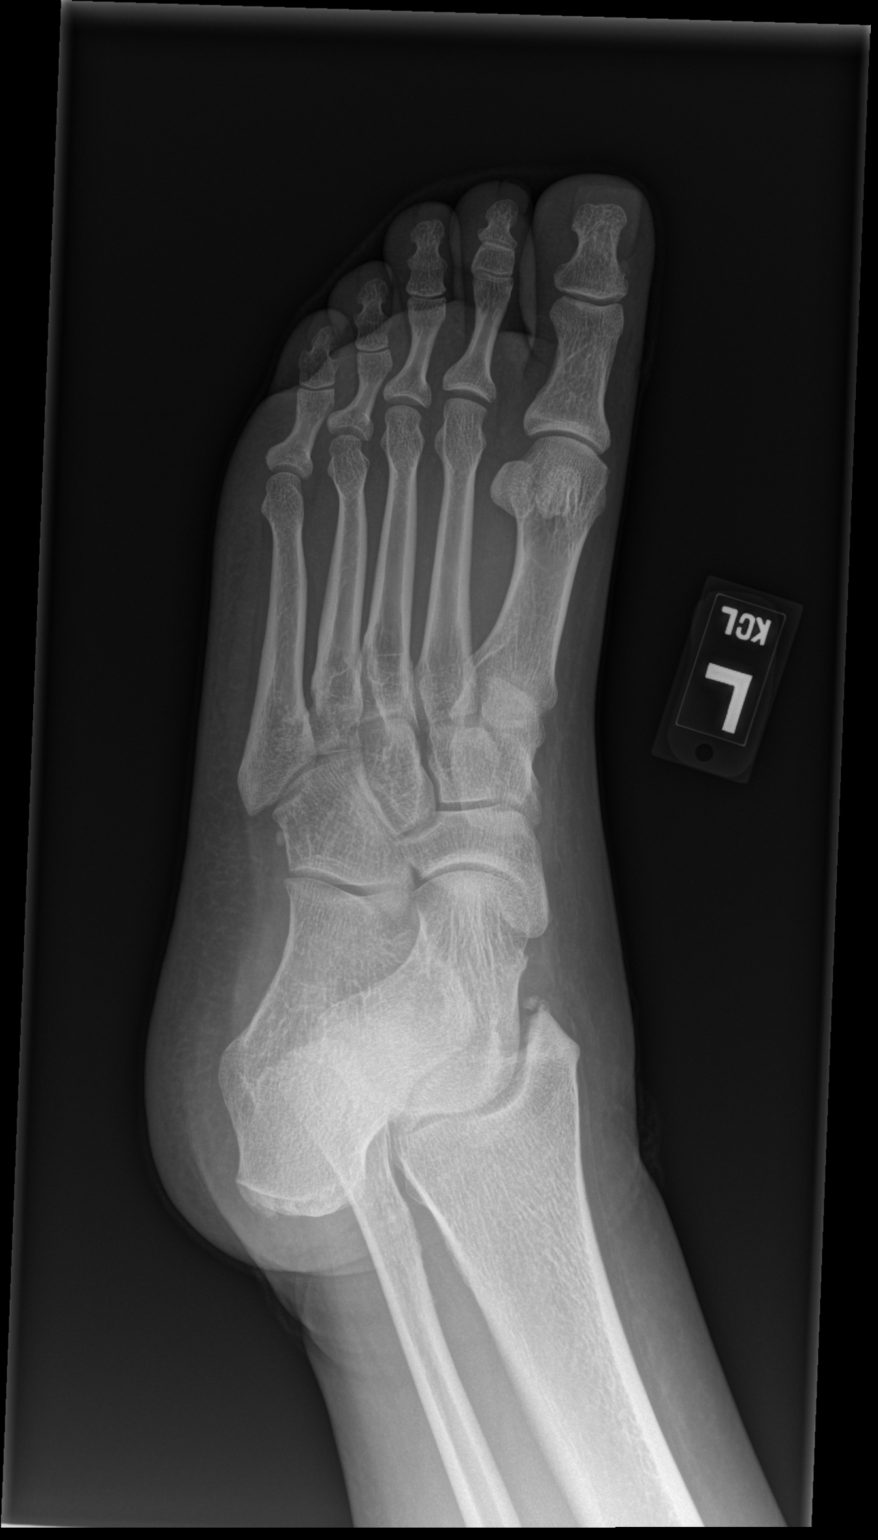

[x foot lat left]
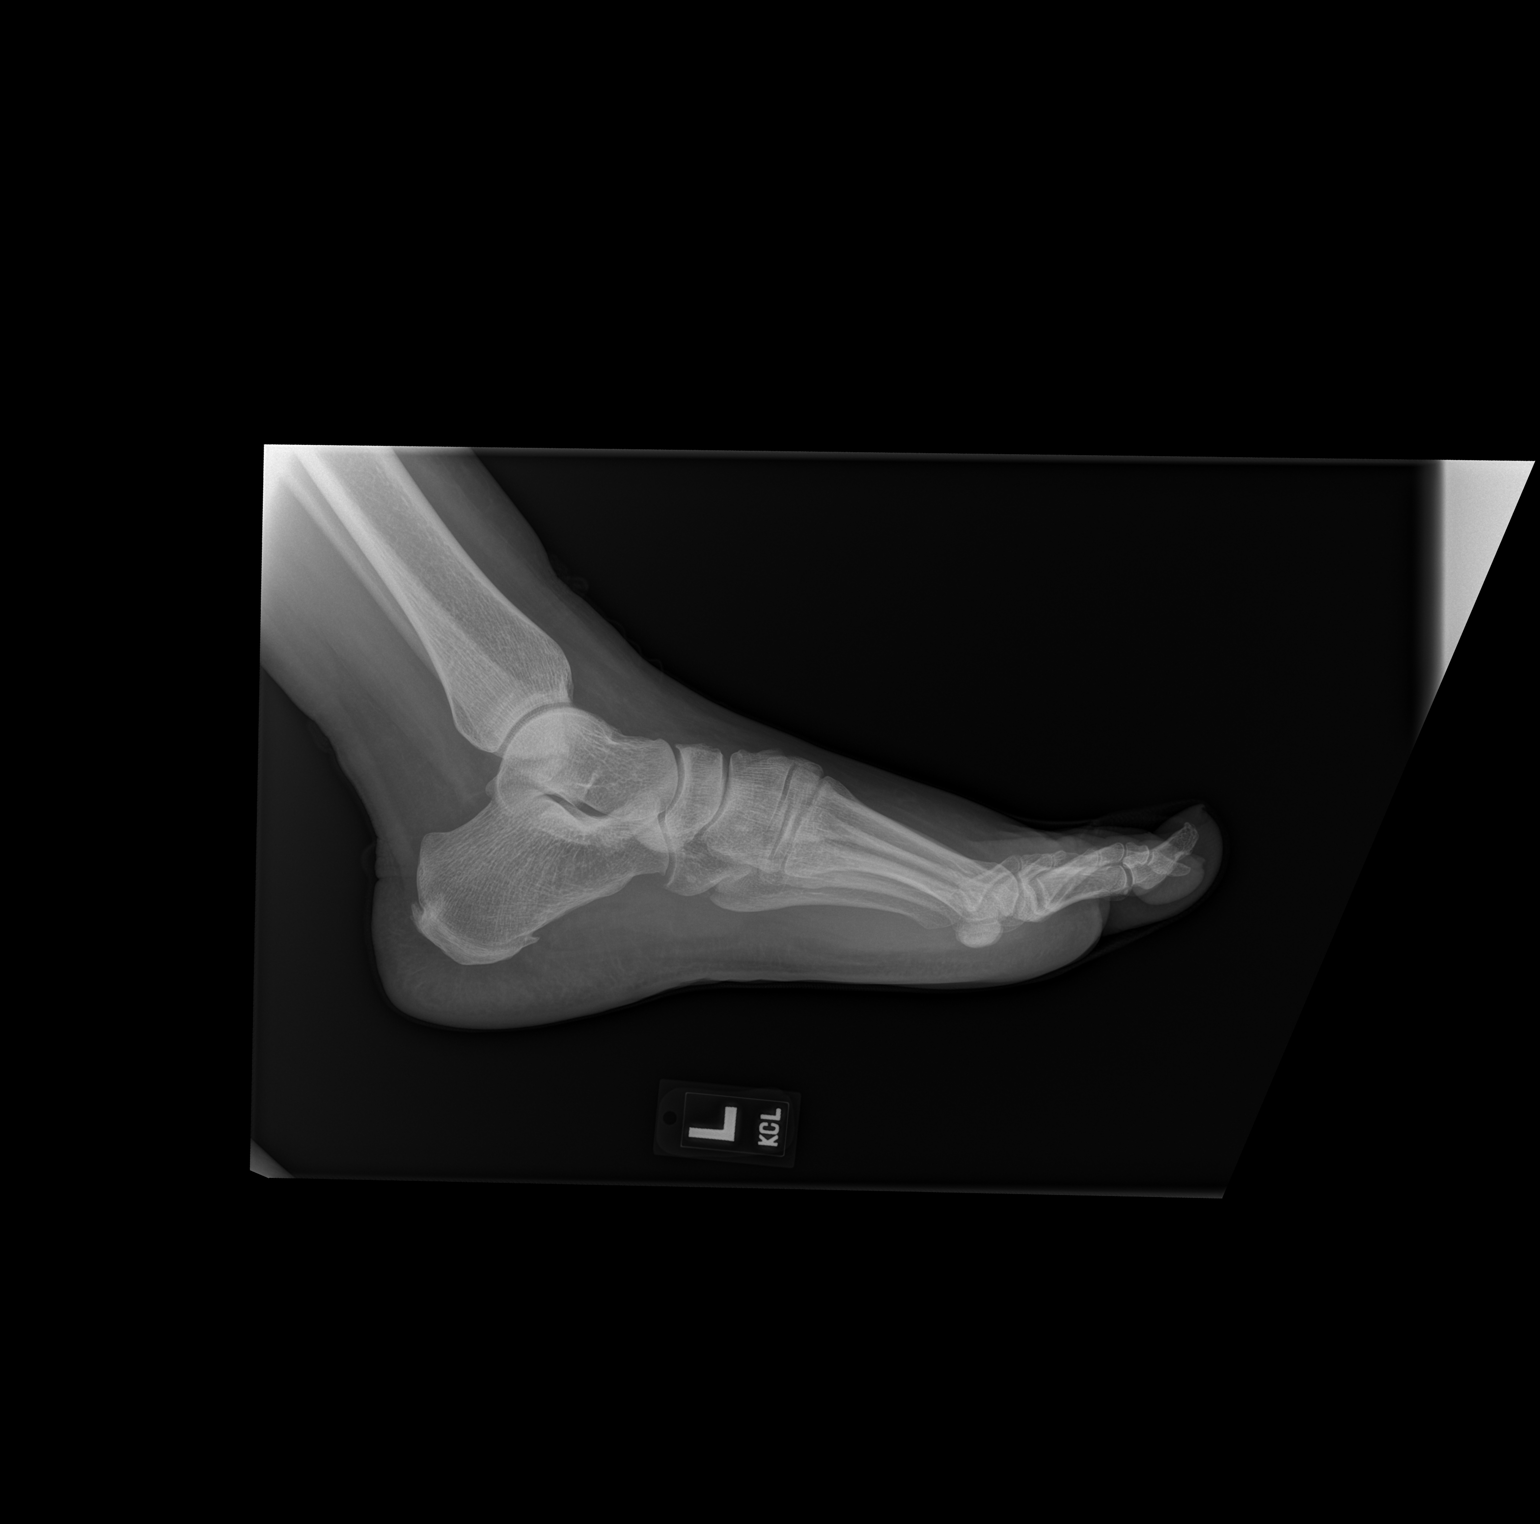

[3 of 3 positions shown; findings below may reference images not displayed]

FINDINGS: No acute bony abnormality. Specifically, no fracture, subluxation,
or dislocation. Small plantar calcaneal spur.
IMPRESSION: No acute bony abnormality.

## 2021-05-10 ENCOUNTER — Ambulatory Visit
Admission: EM | Admit: 2021-05-10 | Discharge: 2021-05-10 | Disposition: A | Discharge status: Home / Self Care | Payer: Medicaid Other | Attending: Internal Medicine | Admitting: Internal Medicine

## 2021-05-10 ENCOUNTER — Other Ambulatory Visit: Payer: Self-pay

## 2021-05-10 ENCOUNTER — Encounter: Payer: Self-pay | Admitting: Emergency Medicine

## 2021-05-10 DIAGNOSIS — L739 Follicular disorder, unspecified: Secondary | ICD-10-CM

## 2021-05-10 MED ORDER — DOXYCYCLINE HYCLATE 100 MG PO CAPS: 100.0000 mg | ORAL_CAPSULE | Freq: Two times a day (BID) | ORAL | 0 refills | Status: DC

## 2021-05-10 NOTE — Discharge Instructions (Signed)
You have an infected hair follicle.  This is being treated with doxycycline antibiotic.  Please take this medication with food to avoid nausea.  Also use warm compresses.  Please follow-up with primary care or urgent care if symptoms persist.

## 2021-05-10 NOTE — ED Triage Notes (Signed)
C/o a boil on her vaginal area, painful, present x 2 days.

## 2021-05-10 NOTE — ED Provider Notes (Signed)
[facility]    CSN: [id number] Arrival date & time: 05/10/21  1810      History   Chief Complaint Chief Complaint  Patient presents with   Abscess    HPI Shelby Harris is a 45 y.o. female.   Patient presents with lesion to her vaginal area that is painful and has been present for approximately 2 days.  Denies any drainage to the area.  Denies any known exposure to STD.  Denies any fevers.   Abscess  Past Medical History:  Diagnosis Date   Anemia    Hypertension    Hyperthyroidism    with pregnancy   IBS (irritable bowel syndrome)     Patient Active Problem List   Diagnosis Date Noted   LACERATION, FINGER 01/09/2010   VAGINITIS, BACTERIAL 07/08/2008    Past Surgical History:  Procedure Laterality Date   CESAREAN SECTION     TONSILLECTOMY     as child    OB History     Gravida  2   Para  2   Term  2   Preterm  0   AB  0   Living  2      SAB  0   IAB  0   Ectopic  0   Multiple  0   Live Births  2            Home Medications    Prior to Admission medications   Medication Sig Start Date End Date Taking? Authorizing Provider  doxycycline (VIBRAMYCIN) 100 MG capsule Take 1 capsule (100 mg total) by mouth 2 (two) times daily. 05/10/21  Yes Cardale Dorer, Acie Fredrickson, FNP  dicyclomine (BENTYL) 20 MG tablet Take 1 tablet (20 mg total) by mouth 2 (two) times daily. 05/08/18   Harris, Abigail, PA-C  escitalopram (LEXAPRO) 10 MG tablet Take 1 tablet (10 mg total) by mouth daily. Patient not taking: Reported on 05/08/2018 04/26/14   Nelwyn Salisbury, MD  ibuprofen (ADVIL,MOTRIN) 200 MG tablet Take 800 mg by mouth every 8 (eight) hours as needed. For pain    [provider]  loperamide (IMODIUM) 2 MG capsule Take 1-2 capsules (2-4 mg total) by mouth daily as needed for diarrhea or loose stools. 05/08/18   Arthor Captain, PA-C  Multiple Vitamins-Minerals (MULTIVITAMIN WITH MINERALS) tablet Take 1 tablet by mouth daily.    [provider]  ondansetron (ZOFRAN) 4 MG tablet Take 1 tablet (4 mg total) by mouth every 8 (eight) hours as needed for nausea or vomiting. 05/08/18   Harris, Cammy Copa, PA-C  polyethylene glycol (MIRALAX / GLYCOLAX) packet Take 17 g by mouth daily as needed for mild constipation.    [provider]  traMADol (ULTRAM) 50 MG tablet Take 1 tablet (50 mg total) by mouth every 6 (six) hours as needed. Patient not taking: Reported on 05/08/2018 12/04/17   Janne Napoleon, NP  ferrous sulfate 300 (60 FE) MG/5ML syrup Take 300 mg by mouth daily.    09/12/11  [provider]    Family History Family History  Problem Relation Age of Onset   Diabetes Unknown     Social History Social History   Tobacco Use   Smoking status: Every Day    Packs/day: 1.00    Types: Cigarettes   Smokeless tobacco: Never   Tobacco comments:    1/2 or less  Vaping Use   Vaping Use: Never used  Substance Use Topics   Alcohol use: No  Drug use: No     Allergies   Patient has no known allergies.   Review of Systems Review of Systems Per HPI  Physical Exam Triage Vital Signs ED Triage Vitals [05/10/21 1937]  Enc Vitals Group     BP (!) 149/94     Pulse Rate 89     Resp 16     Temp 98.4 F (36.9 C)     Temp Source Oral     SpO2 98 %     Weight      Height      Head Circumference      Peak Flow      Pain Score 8     Pain Loc      Pain Edu?      Excl. in GC?    No data found.  Updated Vital Signs BP (!) 149/94 (BP Location: Left Arm)   Pulse 89   Temp 98.4 F (36.9 C) (Oral)   Resp 16   SpO2 98%   Visual Acuity Right Eye Distance:   Left Eye Distance:   Bilateral Distance:    Right Eye Near:   Left Eye Near:    Bilateral Near:     Physical Exam Exam conducted with a chaperone present.  Constitutional:      Appearance: Normal appearance.  HENT:     Head: Normocephalic and atraumatic.  Eyes:     Extraocular Movements: Extraocular movements intact.      Conjunctiva/sclera: Conjunctivae normal.  Pulmonary:     Effort: Pulmonary effort is normal.  Genitourinary:    Comments: Approximately 1 cm x 1 cm indurated abscess consistent with hair follicle present to left lower portion of mons pubis. Neurological:     General: No focal deficit present.     Mental Status: She is alert and oriented to person, place, and time. Mental status is at baseline.  Psychiatric:        Mood and Affect: Mood normal.        Behavior: Behavior normal.        Thought Content: Thought content normal.        Judgment: Judgment normal.     UC Treatments / Results  Labs (all labs ordered are listed, but only abnormal results are displayed) Labs Reviewed - No data to display  EKG   Radiology No results found.  Procedures Procedures (including critical care time)  Medications Ordered in UC Medications - No data to display  Initial Impression / Assessment and Plan / UC Course  I have reviewed the triage vital signs and the nursing notes.  Pertinent labs & imaging results that were available during my care of the patient were reviewed by me and considered in my medical decision making (see chart for details).     Patient has abscess to vaginal area that is consistent with folliculitis.  Will treat with doxycycline antibiotic x10 days.  No I&D warranted at this time given appearance of abscess on physical exam.  Patient to use warm compresses as well.  No red flags on exam.  Discussed return precautions.  Patient verbalized understanding and was agreeable with plan. Final Clinical Impressions(s) / UC Diagnoses   Final diagnoses:  Folliculitis     Discharge Instructions      You have an infected hair follicle.  This is being treated with doxycycline antibiotic.  Please take this medication with food to avoid nausea.  Also use warm compresses.  Please follow-up with primary care or  urgent care if symptoms persist.     ED Prescriptions      Medication Sig Dispense Auth. Provider   doxycycline (VIBRAMYCIN) 100 MG capsule Take 1 capsule (100 mg total) by mouth 2 (two) times daily. 20 capsule Gustavus Bryant, [place]      PDMP not reviewed this encounter.   Gustavus Bryant, [place] 05/10/21 2004

## 2021-08-09 ENCOUNTER — Telehealth: Payer: Self-pay | Admitting: Family Medicine

## 2021-08-09 NOTE — Telephone Encounter (Signed)
Patient stated that she was informed that she could become a patient of Dr.Fry's again.   Is this true?  Please advise.

## 2021-08-09 NOTE — Telephone Encounter (Signed)
Please advise 

## 2021-08-10 NOTE — Telephone Encounter (Signed)
Pt has been sch for 08-14-2021 and will pick up new pt pt

## 2021-08-10 NOTE — Telephone Encounter (Signed)
Yes I can see her  

## 2021-08-10 NOTE — Telephone Encounter (Signed)
LVM for patient to call back to schedule a new patient appointment ok'd by Dr.Fry.

## 2021-08-10 NOTE — Telephone Encounter (Signed)
Noted  

## 2021-08-14 ENCOUNTER — Ambulatory Visit (INDEPENDENT_AMBULATORY_CARE_PROVIDER_SITE_OTHER): Payer: BC Managed Care – PPO | Admitting: Family Medicine

## 2021-08-14 ENCOUNTER — Encounter: Payer: Self-pay | Admitting: Family Medicine

## 2021-08-14 VITALS — BP 120/78 | HR 92 | Temp 98.7°F | Ht 63.75 in | Wt 192.0 lb

## 2021-08-14 DIAGNOSIS — M79604 Pain in right leg: Secondary | ICD-10-CM | POA: Diagnosis not present

## 2021-08-14 DIAGNOSIS — R600 Localized edema: Secondary | ICD-10-CM

## 2021-08-14 DIAGNOSIS — M79605 Pain in left leg: Secondary | ICD-10-CM | POA: Diagnosis not present

## 2021-08-14 MED ORDER — TRAMADOL HCL 50 MG PO TABS: 50.0000 mg | ORAL_TABLET | Freq: Four times a day (QID) | ORAL | 0 refills | Status: DC | PRN

## 2021-08-14 MED ORDER — FUROSEMIDE 20 MG PO TABS: 20.0000 mg | ORAL_TABLET | Freq: Every day | ORAL | 3 refills | Status: DC

## 2021-08-14 NOTE — Progress Notes (Signed)
° °  Subjective:    Patient ID: Shelby Harris, female    DOB: 1976/06/13, 46 y.o.   MRN: [id number]  HPI Here to re-establish with [place] after an absence since 2015. She did not have health insurance for a number of years, but now she does and she wants to improve her health. Her main concern today is bilateral leg pain and leg swelling. The swelling began several years ago, and the pain began about 3 months ago. She takes Tylenol or Ibuprofen for this, with mixed results. No chest pain or SOB. 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    Review of Systems  Constitutional: Negative.   Respiratory: Negative.    Cardiovascular:  Positive for leg swelling. Negative for chest pain and palpitations.  Gastrointestinal: Negative.   Genitourinary: Negative.   Musculoskeletal:  Positive for myalgias.  Neurological: Negative.       Objective:   Physical Exam Constitutional:      General: She is not in acute distress.    Appearance: Normal appearance.  Cardiovascular:     Rate and Rhythm: Normal rate and regular rhythm.     Pulses: Normal pulses.     Heart sounds: Normal heart sounds.  Pulmonary:     Effort: Pulmonary effort is normal.     Breath sounds: Normal breath sounds.  Musculoskeletal:     Comments: 1+ edema in both lower legs   Lymphadenopathy:     Cervical: No cervical adenopathy.  Neurological:     General: No focal deficit present.     Mental Status: She is alert and oriented to person, place, and time.          Assessment & Plan:  Visit to re-establish in this patient who has received medical care for years. Her leg pain is likely the result of edema, so we will start her on Lasix 20 mg daily. She can use Tramadol for more severe pains. She will return soon for a well exam with fasting labs, and we will reassess the leg pains. I encouraged her to re-establish with her GYN as well, and she agreed. Gershon Crane, MD

## 2021-08-15 DIAGNOSIS — R600 Localized edema: Secondary | ICD-10-CM | POA: Insufficient documentation

## 2021-08-18 ENCOUNTER — Telehealth: Payer: Self-pay

## 2021-08-18 NOTE — Telephone Encounter (Signed)
Pt PA for Tramadol was approved from 08/17/2021 - 02/14/2022

## 2021-09-14 ENCOUNTER — Ambulatory Visit (INDEPENDENT_AMBULATORY_CARE_PROVIDER_SITE_OTHER): Payer: BC Managed Care – PPO | Admitting: Family Medicine

## 2021-09-14 ENCOUNTER — Encounter: Payer: Self-pay | Admitting: Family Medicine

## 2021-09-14 VITALS — BP 130/80 | HR 93 | Temp 98.2°F | Ht 63.5 in | Wt 191.0 lb

## 2021-09-14 DIAGNOSIS — Z0000 Encounter for general adult medical examination without abnormal findings: Secondary | ICD-10-CM | POA: Diagnosis not present

## 2021-09-14 DIAGNOSIS — O99019 Anemia complicating pregnancy, unspecified trimester: Secondary | ICD-10-CM | POA: Diagnosis not present

## 2021-09-14 DIAGNOSIS — E0590 Thyrotoxicosis, unspecified without thyrotoxic crisis or storm: Secondary | ICD-10-CM | POA: Diagnosis not present

## 2021-09-14 DIAGNOSIS — Z23 Encounter for immunization: Secondary | ICD-10-CM

## 2021-09-14 DIAGNOSIS — K5900 Constipation, unspecified: Secondary | ICD-10-CM

## 2021-09-14 DIAGNOSIS — I10 Essential (primary) hypertension: Secondary | ICD-10-CM

## 2021-09-14 DIAGNOSIS — O99280 Endocrine, nutritional and metabolic diseases complicating pregnancy, unspecified trimester: Secondary | ICD-10-CM | POA: Diagnosis not present

## 2021-09-14 LAB — LIPID PANEL
Cholesterol: 103 mg/dL (ref 0–200)
HDL: 49.3 mg/dL (ref >39.00)
LDL Cholesterol: 39 mg/dL (ref 0–99)
NonHDL: 54.09
Total CHOL/HDL Ratio: 2
Triglycerides: 76 mg/dL (ref 0.0–149.0)
VLDL: 15.2 mg/dL (ref 0.0–40.0)

## 2021-09-14 LAB — BASIC METABOLIC PANEL
BUN: 10 mg/dL (ref 6–23)
CO2: 26 mEq/L (ref 19–32)
Calcium: 9.6 mg/dL (ref 8.4–10.5)
Chloride: 103 mEq/L (ref 96–112)
Creatinine, Ser: 0.8 mg/dL (ref 0.40–1.20)
GFR: 90.16 mL/min (ref >60.00)
Glucose, Bld: 85 mg/dL (ref 70–99)
Potassium: 3.7 mEq/L (ref 3.5–5.1)
Sodium: 137 mEq/L (ref 135–145)

## 2021-09-14 LAB — CBC WITH DIFFERENTIAL/PLATELET
Basophils Absolute: 0.1 10*3/uL (ref 0.0–0.1)
Basophils Relative: 1.1 % (ref 0.0–3.0)
Eosinophils Absolute: 0.1 10*3/uL (ref 0.0–0.7)
Eosinophils Relative: 2.2 % (ref 0.0–5.0)
HCT: 37.6 % (ref 36.0–46.0)
Hemoglobin: 12.4 g/dL (ref 12.0–15.0)
Lymphocytes Relative: 25.4 % (ref 12.0–46.0)
Lymphs Abs: 1.4 10*3/uL (ref 0.7–4.0)
MCHC: 32.9 g/dL (ref 30.0–36.0)
MCV: 84.8 fl (ref 78.0–100.0)
Monocytes Absolute: 0.4 10*3/uL (ref 0.1–1.0)
Monocytes Relative: 8 % (ref 3.0–12.0)
Neutro Abs: 3.4 10*3/uL (ref 1.4–7.7)
Neutrophils Relative %: 63.3 % (ref 43.0–77.0)
Platelets: 350 10*3/uL (ref 150.0–400.0)
RBC: 4.44 Mil/uL (ref 3.87–5.11)
RDW: 14.1 % (ref 11.5–15.5)
WBC: 5.4 10*3/uL (ref 4.0–10.5)

## 2021-09-14 LAB — HEPATIC FUNCTION PANEL
ALT: 10 U/L (ref 0–35)
AST: 15 U/L (ref 0–37)
Albumin: 4.4 g/dL (ref 3.5–5.2)
Alkaline Phosphatase: 81 U/L (ref 39–117)
Bilirubin, Direct: 0.2 mg/dL (ref 0.0–0.3)
Total Bilirubin: 0.7 mg/dL (ref 0.2–1.2)
Total Protein: 8.2 g/dL (ref 6.0–8.3)

## 2021-09-14 LAB — HEMOGLOBIN A1C: Hgb A1c MFr Bld: 4.9 % (ref 4.6–6.5)

## 2021-09-14 LAB — IBC + FERRITIN
Ferritin: 30.9 ng/mL (ref 10.0–291.0)
Iron: 94 ug/dL (ref 42–145)
Saturation Ratios: 23.6 % (ref 20.0–50.0)
TIBC: 397.6 ug/dL (ref 250.0–450.0)
Transferrin: 284 mg/dL (ref 212.0–360.0)

## 2021-09-14 LAB — TSH: TSH: 1.13 u[IU]/mL (ref 0.35–5.50)

## 2021-09-14 LAB — T4, FREE: Free T4: 0.84 ng/dL (ref 0.60–1.60)

## 2021-09-14 LAB — T3, FREE: T3, Free: 4 pg/mL (ref 2.3–4.2)

## 2021-09-14 LAB — IRON: Iron: 94 ug/dL (ref 42–145)

## 2021-09-14 NOTE — Addendum Note (Signed)
Addended by: Wyvonne Lenz on: 09/14/2021 09:24 AM ? ? Modules accepted: Orders ? ?

## 2021-09-14 NOTE — Progress Notes (Signed)
? ?Subjective:  ? ? Patient ID: Shelby Harris, female    DOB: 1976-02-15, 46 y.o.   MRN: [id number] ? ?HPI ?Here for a well exam. She feels fine. We saw her a few weeks ago for fluid in the legs. She has been taking Lasix every day, and this has worked quite well for her. The leg pains have resolved. Otherwise she still deals with some constipation. She has taken Miralax daily for years, and recently she has been adding milk of Magnesia to this with good results. She tried to take an OTC iron supplement, but she can only do this 2-3 days a week because it constipates her. She has made an appt to see her GYN again soon. ? ? ?Review of Systems  ?Constitutional: Negative.   ?HENT: Negative.    ?Eyes: Negative.   ?Respiratory: Negative.    ?Cardiovascular: Negative.   ?Gastrointestinal:  Positive for constipation.  ?Genitourinary:  Negative for decreased urine volume, difficulty urinating, dyspareunia, dysuria, enuresis, flank pain, frequency, hematuria, pelvic pain and urgency.  ?Musculoskeletal: Negative.   ?Skin: Negative.   ?Neurological: Negative.  Negative for headaches.  ?Psychiatric/Behavioral: Negative.    ? ?   ?Objective:  ? Physical Exam ?Constitutional:   ?   General: She is not in acute distress. ?   Appearance: Normal appearance. She is well-developed.  ?HENT:  ?   Head: Normocephalic and atraumatic.  ?   Right Ear: External ear normal.  ?   Left Ear: External ear normal.  ?   Nose: Nose normal.  ?   Mouth/Throat:  ?   Pharynx: No oropharyngeal exudate.  ?Eyes:  ?   General: No scleral icterus. ?   Conjunctiva/sclera: Conjunctivae normal.  ?   Pupils: Pupils are equal, round, and reactive to light.  ?Neck:  ?   Thyroid: No thyromegaly.  ?   Vascular: No JVD.  ?Cardiovascular:  ?   Rate and Rhythm: Normal rate and regular rhythm.  ?   Heart sounds: Normal heart sounds. No murmur heard. ?  No friction rub. No gallop.  ?Pulmonary:  ?   Effort: Pulmonary effort is normal. No respiratory distress.  ?   Breath  sounds: Normal breath sounds. No wheezing or rales.  ?Chest:  ?   Chest wall: No tenderness.  ?Abdominal:  ?   General: Bowel sounds are normal. There is no distension.  ?   Palpations: Abdomen is soft. There is no mass.  ?   Tenderness: There is no abdominal tenderness. There is no guarding or rebound.  ?Musculoskeletal:     ?   General: No tenderness. Normal range of motion.  ?   Cervical back: Normal range of motion and neck supple.  ?Lymphadenopathy:  ?   Cervical: No cervical adenopathy.  ?Skin: ?   General: Skin is warm and dry.  ?   Findings: No erythema or rash.  ?Neurological:  ?   Mental Status: She is alert and oriented to person, place, and time.  ?   Cranial Nerves: No cranial nerve deficit.  ?   Motor: No abnormal muscle tone.  ?   Coordination: Coordination normal.  ?   Deep Tendon Reflexes: Reflexes are normal and symmetric. Reflexes normal.  ?Psychiatric:     ?   Behavior: Behavior normal.     ?   Thought Content: Thought content normal.     ?   Judgment: Judgment normal.  ? ? ? ? ? ?   ?Assessment &  Plan:  ?Well exam. We discussed diet and exercise. Get fasting labs. ?Alysia Penna, MD ? ? ?

## 2022-03-24 ENCOUNTER — Emergency Department (HOSPITAL_COMMUNITY)
Admission: EM | Admit: 2022-03-24 | Discharge: 2022-03-24 | Discharge status: Against Medical Advice | Payer: BC Managed Care – PPO | Attending: Emergency Medicine | Admitting: Emergency Medicine

## 2022-03-24 ENCOUNTER — Encounter (HOSPITAL_COMMUNITY): Payer: Self-pay | Admitting: Emergency Medicine

## 2022-03-24 ENCOUNTER — Other Ambulatory Visit: Payer: Self-pay

## 2022-03-24 DIAGNOSIS — Z5321 Procedure and treatment not carried out due to patient leaving prior to being seen by health care provider: Secondary | ICD-10-CM | POA: Diagnosis not present

## 2022-03-24 DIAGNOSIS — I10 Essential (primary) hypertension: Secondary | ICD-10-CM | POA: Diagnosis not present

## 2022-03-24 DIAGNOSIS — R109 Unspecified abdominal pain: Secondary | ICD-10-CM | POA: Diagnosis not present

## 2022-03-24 DIAGNOSIS — R1110 Vomiting, unspecified: Secondary | ICD-10-CM | POA: Insufficient documentation

## 2022-03-24 LAB — URINALYSIS, MICROSCOPIC (REFLEX)

## 2022-03-24 LAB — COMPREHENSIVE METABOLIC PANEL
ALT: 11 U/L (ref 0–44)
AST: 18 U/L (ref 15–41)
Albumin: 3.5 g/dL (ref 3.5–5.0)
Alkaline Phosphatase: 68 U/L (ref 38–126)
Anion gap: 8 (ref 5–15)
BUN: 6 mg/dL (ref 6–20)
CO2: 22 mmol/L (ref 22–32)
Calcium: 8.9 mg/dL (ref 8.9–10.3)
Chloride: 109 mmol/L (ref 98–111)
Creatinine, Ser: 0.76 mg/dL (ref 0.44–1.00)
GFR, Estimated: >60 mL/min (ref >60)
Glucose, Bld: 118 mg/dL — ABNORMAL HIGH (ref 70–99)
Potassium: 3.7 mmol/L (ref 3.5–5.1)
Sodium: 139 mmol/L (ref 135–145)
Total Bilirubin: 0.3 mg/dL (ref 0.3–1.2)
Total Protein: 7 g/dL (ref 6.5–8.1)

## 2022-03-24 LAB — LIPASE, BLOOD: Lipase: 29 U/L (ref 11–51)

## 2022-03-24 LAB — URINALYSIS, ROUTINE W REFLEX MICROSCOPIC
Bilirubin Urine: NEGATIVE
Glucose, UA: NEGATIVE mg/dL
Ketones, ur: NEGATIVE mg/dL
Leukocytes,Ua: NEGATIVE
Nitrite: NEGATIVE
Protein, ur: 30 mg/dL — AB
Specific Gravity, Urine: 1.025 (ref 1.005–1.030)
pH: 6.5 (ref 5.0–8.0)

## 2022-03-24 LAB — CBC
HCT: 36 % (ref 36.0–46.0)
Hemoglobin: 11.2 g/dL — ABNORMAL LOW (ref 12.0–15.0)
MCH: 27.3 pg (ref 26.0–34.0)
MCHC: 31.1 g/dL (ref 30.0–36.0)
MCV: 87.8 fL (ref 80.0–100.0)
Platelets: 358 10*3/uL (ref 150–400)
RBC: 4.1 MIL/uL (ref 3.87–5.11)
RDW: 14.3 % (ref 11.5–15.5)
WBC: 9.1 10*3/uL (ref 4.0–10.5)
nRBC: 0 % (ref 0.0–0.2)

## 2022-03-24 LAB — I-STAT BETA HCG BLOOD, ED (MC, WL, AP ONLY): I-stat hCG, quantitative: <5 m[IU]/mL (ref <5)

## 2022-03-24 MED ORDER — ONDANSETRON 4 MG PO TBDP
4.0000 mg | ORAL_TABLET | Freq: Once | ORAL | Status: AC | PRN
  Administered 2022-03-24: 4 mg via ORAL
  Filled 2022-03-24: qty 1

## 2022-03-24 NOTE — ED Notes (Signed)
Pt stated she was leaving AMA due to wait 

## 2022-03-24 NOTE — ED Triage Notes (Addendum)
Patient reports emesis with pain across abdomen this evening , denies diarrhea or fever . Hypertensive at arrival .

## 2022-04-05 LAB — CYTOLOGY - PAP: Pap: NEGATIVE

## 2022-05-31 ENCOUNTER — Ambulatory Visit (HOSPITAL_BASED_OUTPATIENT_CLINIC_OR_DEPARTMENT_OTHER)
Admission: EM | Admit: 2022-05-31 | Discharge: 2022-06-01 | Disposition: A | Discharge status: Home / Self Care | Payer: BC Managed Care – PPO | Attending: General Surgery | Admitting: General Surgery

## 2022-05-31 ENCOUNTER — Encounter (HOSPITAL_BASED_OUTPATIENT_CLINIC_OR_DEPARTMENT_OTHER): Payer: Self-pay | Admitting: Emergency Medicine

## 2022-05-31 ENCOUNTER — Other Ambulatory Visit: Payer: Self-pay

## 2022-05-31 DIAGNOSIS — F17210 Nicotine dependence, cigarettes, uncomplicated: Secondary | ICD-10-CM | POA: Insufficient documentation

## 2022-05-31 DIAGNOSIS — Z79899 Other long term (current) drug therapy: Secondary | ICD-10-CM | POA: Diagnosis not present

## 2022-05-31 DIAGNOSIS — I10 Essential (primary) hypertension: Secondary | ICD-10-CM | POA: Insufficient documentation

## 2022-05-31 DIAGNOSIS — K589 Irritable bowel syndrome without diarrhea: Secondary | ICD-10-CM | POA: Diagnosis not present

## 2022-05-31 DIAGNOSIS — K810 Acute cholecystitis: Secondary | ICD-10-CM

## 2022-05-31 DIAGNOSIS — K8012 Calculus of gallbladder with acute and chronic cholecystitis without obstruction: Secondary | ICD-10-CM | POA: Insufficient documentation

## 2022-05-31 LAB — COMPREHENSIVE METABOLIC PANEL
ALT: 15 U/L (ref 0–44)
AST: 17 U/L (ref 15–41)
Albumin: 4.3 g/dL (ref 3.5–5.0)
Alkaline Phosphatase: 56 U/L (ref 38–126)
Anion gap: 10 (ref 5–15)
BUN: 7 mg/dL (ref 6–20)
CO2: 23 mmol/L (ref 22–32)
Calcium: 9 mg/dL (ref 8.9–10.3)
Chloride: 103 mmol/L (ref 98–111)
Creatinine, Ser: 0.78 mg/dL (ref 0.44–1.00)
GFR, Estimated: >60 mL/min (ref >60)
Glucose, Bld: 126 mg/dL — ABNORMAL HIGH (ref 70–99)
Potassium: 4.1 mmol/L (ref 3.5–5.1)
Sodium: 136 mmol/L (ref 135–145)
Total Bilirubin: 0.4 mg/dL (ref 0.3–1.2)
Total Protein: 7.8 g/dL (ref 6.5–8.1)

## 2022-05-31 LAB — CBC
HCT: 36.4 % (ref 36.0–46.0)
Hemoglobin: 11.4 g/dL — ABNORMAL LOW (ref 12.0–15.0)
MCH: 27.3 pg (ref 26.0–34.0)
MCHC: 31.3 g/dL (ref 30.0–36.0)
MCV: 87.3 fL (ref 80.0–100.0)
Platelets: 394 10*3/uL (ref 150–400)
RBC: 4.17 MIL/uL (ref 3.87–5.11)
RDW: 14.7 % (ref 11.5–15.5)
WBC: 10 10*3/uL (ref 4.0–10.5)
nRBC: 0 % (ref 0.0–0.2)

## 2022-05-31 LAB — URINALYSIS, ROUTINE W REFLEX MICROSCOPIC
Bilirubin Urine: NEGATIVE
Glucose, UA: NEGATIVE mg/dL
Ketones, ur: NEGATIVE mg/dL
Leukocytes,Ua: NEGATIVE
Nitrite: NEGATIVE
Protein, ur: 30 mg/dL — AB
Specific Gravity, Urine: 1.034 — ABNORMAL HIGH (ref 1.005–1.030)
pH: 7.5 (ref 5.0–8.0)

## 2022-05-31 LAB — LIPASE, BLOOD: Lipase: 93 U/L — ABNORMAL HIGH (ref 11–51)

## 2022-05-31 LAB — PREGNANCY, URINE: Preg Test, Ur: NEGATIVE

## 2022-05-31 NOTE — ED Triage Notes (Signed)
Abdo pain LUQ, 10/10 pain constant pressure since 4 pm N/v  present denies diarrhea.

## 2022-06-01 ENCOUNTER — Other Ambulatory Visit: Payer: Self-pay

## 2022-06-01 ENCOUNTER — Emergency Department (HOSPITAL_BASED_OUTPATIENT_CLINIC_OR_DEPARTMENT_OTHER): Payer: BC Managed Care – PPO

## 2022-06-01 ENCOUNTER — Encounter (HOSPITAL_BASED_OUTPATIENT_CLINIC_OR_DEPARTMENT_OTHER): Payer: Self-pay | Admitting: Certified Registered Nurse Anesthetist

## 2022-06-01 ENCOUNTER — Emergency Department (HOSPITAL_COMMUNITY): Payer: BC Managed Care – PPO

## 2022-06-01 ENCOUNTER — Emergency Department (HOSPITAL_COMMUNITY): Payer: BC Managed Care – PPO | Admitting: Certified Registered Nurse Anesthetist

## 2022-06-01 ENCOUNTER — Encounter (HOSPITAL_COMMUNITY)
Admission: EM | Disposition: A | Discharge status: Home / Self Care | Payer: Self-pay | Source: Home / Self Care | Attending: Emergency Medicine

## 2022-06-01 HISTORY — PX: CHOLECYSTECTOMY: SHX55

## 2022-06-01 LAB — OCCULT BLOOD X 1 CARD TO LAB, STOOL: Fecal Occult Bld: NEGATIVE

## 2022-06-01 SURGERY — LAPAROSCOPIC CHOLECYSTECTOMY WITH INTRAOPERATIVE CHOLANGIOGRAM
Anesthesia: General

## 2022-06-01 MED ORDER — SODIUM CHLORIDE 0.9 % IV SOLN: INTRAVENOUS | Status: DC | PRN

## 2022-06-01 MED ORDER — ONDANSETRON HCL 4 MG/2ML IJ SOLN
INTRAMUSCULAR | Status: DC | PRN
  Administered 2022-06-01: 4 mg via INTRAVENOUS

## 2022-06-01 MED ORDER — SODIUM CHLORIDE 0.9 % IR SOLN
Status: DC | PRN
  Administered 2022-06-01: 500 mL

## 2022-06-01 MED ORDER — SCOPOLAMINE 1 MG/3DAYS TD PT72
MEDICATED_PATCH | TRANSDERMAL | Status: AC
  Filled 2022-06-01: qty 1

## 2022-06-01 MED ORDER — SUGAMMADEX SODIUM 200 MG/2ML IV SOLN
INTRAVENOUS | Status: DC | PRN
  Administered 2022-06-01: 200 mg via INTRAVENOUS

## 2022-06-01 MED ORDER — ONDANSETRON HCL 4 MG/2ML IJ SOLN
INTRAMUSCULAR | Status: AC
  Filled 2022-06-01: qty 2

## 2022-06-01 MED ORDER — SUCCINYLCHOLINE CHLORIDE 200 MG/10ML IV SOSY: PREFILLED_SYRINGE | INTRAVENOUS | Status: DC | PRN

## 2022-06-01 MED ORDER — SODIUM CHLORIDE 0.9 % IV SOLN
1.0000 g | Freq: Once | INTRAVENOUS | Status: AC
  Administered 2022-06-01: 1 g via INTRAVENOUS
  Filled 2022-06-01: qty 10

## 2022-06-01 MED ORDER — LIDOCAINE 2% (20 MG/ML) 5 ML SYRINGE
INTRAMUSCULAR | Status: AC
  Filled 2022-06-01: qty 5

## 2022-06-01 MED ORDER — ORAL CARE MOUTH RINSE: 15.0000 mL | Freq: Once | OROMUCOSAL | Status: AC

## 2022-06-01 MED ORDER — OXYCODONE HCL 5 MG/5ML PO SOLN: 5.0000 mg | Freq: Once | ORAL | Status: AC | PRN

## 2022-06-01 MED ORDER — SODIUM CHLORIDE 0.9 % IV BOLUS
1000.0000 mL | Freq: Once | INTRAVENOUS | Status: AC
  Administered 2022-06-01: 1000 mL via INTRAVENOUS

## 2022-06-01 MED ORDER — KETOROLAC TROMETHAMINE 30 MG/ML IJ SOLN
30.0000 mg | Freq: Once | INTRAMUSCULAR | Status: AC | PRN
  Administered 2022-06-01: 30 mg via INTRAVENOUS

## 2022-06-01 MED ORDER — OXYCODONE HCL 5 MG PO TABS
ORAL_TABLET | ORAL | Status: AC
  Filled 2022-06-01: qty 1

## 2022-06-01 MED ORDER — LACTATED RINGERS IV SOLN: INTRAVENOUS | Status: DC

## 2022-06-01 MED ORDER — 0.9 % SODIUM CHLORIDE (POUR BTL) OPTIME
TOPICAL | Status: DC | PRN
  Administered 2022-06-01: 1000 mL

## 2022-06-01 MED ORDER — PROPOFOL 10 MG/ML IV BOLUS
INTRAVENOUS | Status: AC
  Filled 2022-06-01: qty 20

## 2022-06-01 MED ORDER — FENTANYL CITRATE (PF) 250 MCG/5ML IJ SOLN
INTRAMUSCULAR | Status: AC
  Filled 2022-06-01: qty 5

## 2022-06-01 MED ORDER — DEXAMETHASONE SODIUM PHOSPHATE 10 MG/ML IJ SOLN
INTRAMUSCULAR | Status: DC | PRN
  Administered 2022-06-01: 10 mg via INTRAVENOUS

## 2022-06-01 MED ORDER — ACETAMINOPHEN 500 MG PO TABS
1000.0000 mg | ORAL_TABLET | ORAL | Status: AC
  Administered 2022-06-01: 1000 mg via ORAL
  Filled 2022-06-01: qty 2

## 2022-06-01 MED ORDER — ROCURONIUM BROMIDE 10 MG/ML (PF) SYRINGE
PREFILLED_SYRINGE | INTRAVENOUS | Status: AC
  Filled 2022-06-01: qty 10

## 2022-06-01 MED ORDER — MIDAZOLAM HCL 2 MG/2ML IJ SOLN
INTRAMUSCULAR | Status: DC | PRN
  Administered 2022-06-01: 2 mg via INTRAVENOUS

## 2022-06-01 MED ORDER — PROMETHAZINE HCL 25 MG/ML IJ SOLN: 6.2500 mg | INTRAMUSCULAR | Status: DC | PRN

## 2022-06-01 MED ORDER — IOHEXOL 300 MG/ML  SOLN
100.0000 mL | Freq: Once | INTRAMUSCULAR | Status: AC | PRN
  Administered 2022-06-01: 80 mL via INTRAVENOUS

## 2022-06-01 MED ORDER — ROCURONIUM BROMIDE 10 MG/ML (PF) SYRINGE
PREFILLED_SYRINGE | INTRAVENOUS | Status: DC | PRN
  Administered 2022-06-01: 40 mg via INTRAVENOUS

## 2022-06-01 MED ORDER — PROPOFOL 10 MG/ML IV BOLUS
INTRAVENOUS | Status: DC | PRN
  Administered 2022-06-01: 20 mg via INTRAVENOUS
  Administered 2022-06-01: 150 mg via INTRAVENOUS
  Administered 2022-06-01 (×2): 30 mg via INTRAVENOUS

## 2022-06-01 MED ORDER — FENTANYL CITRATE (PF) 100 MCG/2ML IJ SOLN
25.0000 ug | INTRAMUSCULAR | Status: DC | PRN
  Administered 2022-06-01: 25 ug via INTRAVENOUS

## 2022-06-01 MED ORDER — LIDOCAINE 2% (20 MG/ML) 5 ML SYRINGE
INTRAMUSCULAR | Status: DC | PRN
  Administered 2022-06-01: 60 mg via INTRAVENOUS

## 2022-06-01 MED ORDER — FENTANYL CITRATE PF 50 MCG/ML IJ SOSY
50.0000 ug | PREFILLED_SYRINGE | Freq: Once | INTRAMUSCULAR | Status: AC
  Administered 2022-06-01: 50 ug via INTRAVENOUS
  Filled 2022-06-01: qty 1

## 2022-06-01 MED ORDER — SUCCINYLCHOLINE CHLORIDE 200 MG/10ML IV SOSY
PREFILLED_SYRINGE | INTRAVENOUS | Status: DC | PRN
  Administered 2022-06-01: 100 mg via INTRAVENOUS

## 2022-06-01 MED ORDER — MIDAZOLAM HCL 2 MG/2ML IJ SOLN
INTRAMUSCULAR | Status: AC
  Filled 2022-06-01: qty 2

## 2022-06-01 MED ORDER — HYDROMORPHONE HCL 1 MG/ML IJ SOLN
1.0000 mg | Freq: Once | INTRAMUSCULAR | Status: AC
  Administered 2022-06-01: 1 mg via INTRAVENOUS
  Filled 2022-06-01: qty 1

## 2022-06-01 MED ORDER — ESMOLOL HCL 100 MG/10ML IV SOLN
INTRAVENOUS | Status: AC
  Filled 2022-06-01: qty 10

## 2022-06-01 MED ORDER — FENTANYL CITRATE (PF) 100 MCG/2ML IJ SOLN
INTRAMUSCULAR | Status: AC
  Filled 2022-06-01: qty 2

## 2022-06-01 MED ORDER — OXYCODONE HCL 5 MG PO TABS
5.0000 mg | ORAL_TABLET | Freq: Once | ORAL | Status: AC | PRN
  Administered 2022-06-01: 5 mg via ORAL

## 2022-06-01 MED ORDER — ONDANSETRON HCL 4 MG/2ML IJ SOLN
4.0000 mg | Freq: Once | INTRAMUSCULAR | Status: AC
  Administered 2022-06-01: 4 mg via INTRAVENOUS
  Filled 2022-06-01: qty 2

## 2022-06-01 MED ORDER — AMISULPRIDE (ANTIEMETIC) 5 MG/2ML IV SOLN: 10.0000 mg | Freq: Once | INTRAVENOUS | Status: DC | PRN

## 2022-06-01 MED ORDER — CHLORHEXIDINE GLUCONATE 0.12 % MT SOLN
15.0000 mL | Freq: Once | OROMUCOSAL | Status: AC
  Administered 2022-06-01: 15 mL via OROMUCOSAL
  Filled 2022-06-01: qty 15

## 2022-06-01 MED ORDER — SCOPOLAMINE 1 MG/3DAYS TD PT72
MEDICATED_PATCH | TRANSDERMAL | Status: DC | PRN
  Administered 2022-06-01: 1 via TRANSDERMAL

## 2022-06-01 MED ORDER — ESMOLOL HCL 100 MG/10ML IV SOLN
INTRAVENOUS | Status: DC | PRN
  Administered 2022-06-01: 20 mg via INTRAVENOUS
  Administered 2022-06-01: 30 mg via INTRAVENOUS

## 2022-06-01 MED ORDER — TRAMADOL HCL 50 MG PO TABS: 50.0000 mg | ORAL_TABLET | Freq: Four times a day (QID) | ORAL | 0 refills | Status: DC | PRN

## 2022-06-01 MED ORDER — FENTANYL CITRATE (PF) 250 MCG/5ML IJ SOLN
INTRAMUSCULAR | Status: DC | PRN
  Administered 2022-06-01 (×2): 50 ug via INTRAVENOUS
  Administered 2022-06-01: 150 ug via INTRAVENOUS

## 2022-06-01 MED ORDER — KETOROLAC TROMETHAMINE 30 MG/ML IJ SOLN
INTRAMUSCULAR | Status: AC
  Filled 2022-06-01: qty 1

## 2022-06-01 MED ORDER — DEXAMETHASONE SODIUM PHOSPHATE 10 MG/ML IJ SOLN
INTRAMUSCULAR | Status: AC
  Filled 2022-06-01: qty 1

## 2022-06-01 MED ORDER — BUPIVACAINE-EPINEPHRINE (PF) 0.25% -1:200000 IJ SOLN
INTRAMUSCULAR | Status: AC
  Filled 2022-06-01: qty 30

## 2022-06-01 MED ORDER — LIDOCAINE HCL 1 % IJ SOLN
INTRAMUSCULAR | Status: DC | PRN
  Administered 2022-06-01: 10 mL

## 2022-06-01 SURGICAL SUPPLY — 52 items
ADH SKN CLS APL DERMABOND .7 (GAUZE/BANDAGES/DRESSINGS) ×1
APL PRP STRL LF DISP 70% ISPRP (MISCELLANEOUS) ×1
APPLIER CLIP 5 13 M/L LIGAMAX5 (MISCELLANEOUS)
APR CLP MED LRG 5 ANG JAW (MISCELLANEOUS)
BAG COUNTER SPONGE SURGICOUNT (BAG) ×1 IMPLANT
BAG SPEC RTRVL 10 TROC 200 (ENDOMECHANICALS)
BAG SPNG CNTER NS LX DISP (BAG) ×1
CANISTER SUCT 3000ML PPV (MISCELLANEOUS) ×1 IMPLANT
CHLORAPREP W/TINT 26 (MISCELLANEOUS) ×1 IMPLANT
CLIP APPLIE 5 13 M/L LIGAMAX5 (MISCELLANEOUS) IMPLANT
CLIP LIGATING HEMO O LOK GREEN (MISCELLANEOUS) ×1 IMPLANT
COVER MAYO STAND STRL (DRAPES) ×1 IMPLANT
COVER SURGICAL LIGHT HANDLE (MISCELLANEOUS) ×1 IMPLANT
COVER TRANSDUCER ULTRASND (DRAPES) ×1 IMPLANT
DERMABOND ADVANCED .7 DNX12 (GAUZE/BANDAGES/DRESSINGS) ×1 IMPLANT
DRAPE C-ARM 42X120 X-RAY (DRAPES) ×1 IMPLANT
ELECT REM PT RETURN 9FT ADLT (ELECTROSURGICAL) ×1
ELECTRODE REM PT RTRN 9FT ADLT (ELECTROSURGICAL) ×1 IMPLANT
GLOVE BIO SURGEON STRL SZ7.5 (GLOVE) ×1 IMPLANT
GLOVE SURG SYN 7.5  E (GLOVE) ×1
GLOVE SURG SYN 7.5 E (GLOVE) ×1 IMPLANT
GLOVE SURG SYN 7.5 PF PI (GLOVE) ×1 IMPLANT
GOWN STRL REUS W/ TWL LRG LVL3 (GOWN DISPOSABLE) ×2 IMPLANT
GOWN STRL REUS W/ TWL XL LVL3 (GOWN DISPOSABLE) ×1 IMPLANT
GOWN STRL REUS W/TWL LRG LVL3 (GOWN DISPOSABLE) ×2
GOWN STRL REUS W/TWL XL LVL3 (GOWN DISPOSABLE) ×1
GRASPER SUT TROCAR 14GX15 (MISCELLANEOUS) ×1 IMPLANT
IV CATH 14GX2 1/4 (CATHETERS) ×1 IMPLANT
KIT BASIN OR (CUSTOM PROCEDURE TRAY) ×1 IMPLANT
KIT TURNOVER KIT B (KITS) ×1 IMPLANT
NDL INSUFFLATION 14GA 120MM (NEEDLE) ×1 IMPLANT
NEEDLE INSUFFLATION 14GA 120MM (NEEDLE) ×1 IMPLANT
NS IRRIG 1000ML POUR BTL (IV SOLUTION) ×1 IMPLANT
PAD ARMBOARD 7.5X6 YLW CONV (MISCELLANEOUS) ×2 IMPLANT
POUCH LAPAROSCOPIC INSTRUMENT (MISCELLANEOUS) ×1 IMPLANT
POUCH RETRIEVAL ECOSAC 10 (ENDOMECHANICALS) IMPLANT
POUCH RETRIEVAL ECOSAC 10MM (ENDOMECHANICALS)
SCISSORS LAP 5X35 DISP (ENDOMECHANICALS) ×1 IMPLANT
SET CHOLANGIOGRAPHY FRANKLIN (SET/KITS/TRAYS/PACK) ×1 IMPLANT
SET IRRIG TUBING LAPAROSCOPIC (IRRIGATION / IRRIGATOR) ×1 IMPLANT
SET TUBE SMOKE EVAC HIGH FLOW (TUBING) ×1 IMPLANT
SLEEVE ENDOPATH XCEL 5M (ENDOMECHANICALS) ×1 IMPLANT
SPECIMEN JAR SMALL (MISCELLANEOUS) ×1 IMPLANT
STOPCOCK 4 WAY LG BORE MALE ST (IV SETS) ×1 IMPLANT
SUT MNCRL AB 4-0 PS2 18 (SUTURE) ×1 IMPLANT
TOWEL GREEN STERILE (TOWEL DISPOSABLE) ×1 IMPLANT
TOWEL GREEN STERILE FF (TOWEL DISPOSABLE) ×1 IMPLANT
TRAY LAPAROSCOPIC MC (CUSTOM PROCEDURE TRAY) ×1 IMPLANT
TROCAR XCEL NON-BLD 11X100MML (ENDOMECHANICALS) ×1 IMPLANT
TROCAR Z-THREAD OPTICAL 5X100M (TROCAR) ×1 IMPLANT
WARMER LAPAROSCOPE (MISCELLANEOUS) ×1 IMPLANT
WATER STERILE IRR 1000ML POUR (IV SOLUTION) ×1 IMPLANT

## 2022-06-01 NOTE — Discharge Instructions (Addendum)
[facility]  Please arrive at least 30 min before your appointment to complete your check in paperwork.  If you are unable to arrive 30 min prior to your appointment time we may have to cancel or reschedule you. LAPAROSCOPIC SURGERY: POST OP INSTRUCTIONS Always review your discharge instruction sheet given to you by the facility where your surgery was performed. IF YOU HAVE DISABILITY OR FAMILY LEAVE FORMS, YOU MUST BRING THEM TO THE OFFICE FOR PROCESSING.   DO NOT GIVE THEM TO YOUR DOCTOR.  PAIN CONTROL  First take acetaminophen (Tylenol) AND/or ibuprofen (Advil) to control your pain after surgery.  Follow directions on package.  Taking acetaminophen (Tylenol) and/or ibuprofen (Advil) regularly after surgery will help to control your pain and lower the amount of prescription pain medication you may need.  You should not take more than 4,000 mg (4 grams) of acetaminophen (Tylenol) in 24 hours.  You should not take ibuprofen (Advil), aleve, motrin, naprosyn or other NSAIDS if you have a history of stomach ulcers or chronic kidney disease.  A prescription for pain medication may be given to you upon discharge.  Take your pain medication as prescribed, if you still have uncontrolled pain after taking acetaminophen (Tylenol) or ibuprofen (Advil). Use ice packs to help control pain. If you need a refill on your pain medication, please contact your pharmacy.  They will contact our office to request authorization. Prescriptions will not be filled after 5pm or on week-ends.  HOME MEDICATIONS Take your usually prescribed medications unless otherwise directed.  DIET You should follow a light diet the first few days after arrival home.  Be sure to include lots of fluids daily. Avoid fatty, fried foods.   CONSTIPATION It is common to experience some constipation after surgery and if you are taking pain medication.  Increasing fluid intake and taking a stool softener (such as Colace)  will usually help or prevent this problem from occurring.  A mild laxative (Milk of Magnesia or Miralax) should be taken according to package instructions if there are no bowel movements after 48 hours.  WOUND/INCISION CARE Most patients will experience some swelling and bruising in the area of the incisions.  Ice packs will help.  Swelling and bruising can take several days to resolve.  Unless discharge instructions indicate otherwise, follow guidelines below  STERI-STRIPS - you may remove your outer bandages 48 hours after surgery, and you may shower at that time.  You have steri-strips (small skin tapes) in place directly over the incision.  These strips should be left on the skin for 7-10 days.   DERMABOND/SKIN GLUE - you may shower in 24 hours.  The glue will flake off over the next 2-3 weeks. Any sutures or staples will be removed at the office during your follow-up visit.  ACTIVITIES You may resume regular (light) daily activities beginning the next day--such as daily self-care, walking, climbing stairs--gradually increasing activities as tolerated.  You may have sexual intercourse when it is comfortable.  Refrain from any heavy lifting or straining until approved by your doctor. You may drive when you are no longer taking prescription pain medication, you can comfortably wear a seatbelt, and you can safely maneuver your car and apply brakes.  FOLLOW-UP You should see your doctor in the office for a follow-up appointment approximately 2-3 weeks after your surgery.  You should have been given your post-op/follow-up appointment when your surgery was scheduled.  If you did not receive a post-op/follow-up appointment, make sure   that you call for this appointment within a day or two after you arrive home to insure a convenient appointment time.  OTHER INSTRUCTIONS  WHEN TO CALL YOUR DOCTOR: Fever over 101.0 Inability to urinate Continued bleeding from incision. Increased pain, redness, or  drainage from the incision. Increasing abdominal pain  The clinic staff is available to answer your questions during regular business hours.  Please don't hesitate to call and ask to speak to one of the nurses for clinical concerns.  If you have a medical emergency, go to the nearest emergency room or call 911.  A surgeon from [facility] is always on call at the hospital. [address], [address], [place], [place]  [address] ? [address] [address], [place],    [address] [phone] ? [phone] ? FAX [phone]   

## 2022-06-01 NOTE — ED Notes (Signed)
Pt left with sister to go to Admitting for Surgery. IV secure for POV. Report given and spoke with OR and Dr. Derrell Lolling to update that patient is on her way. No c/o pain. VSS. Pt a/ox4.

## 2022-06-01 NOTE — Transfer of Care (Signed)
Immediate Anesthesia Transfer of Care Note  Patient: Shelby Harris  Procedure(s) Performed: LAPAROSCOPIC CHOLECYSTECTOMY WITH INTRAOPERATIVE CHOLANGIOGRAM  Patient Location: PACU  Anesthesia Type:General  Level of Consciousness: awake and drowsy  Airway & Oxygen Therapy: Patient Spontanous Breathing and Patient connected to face mask oxygen  Post-op Assessment: Report given to RN, Post -op Vital signs reviewed and stable, and Patient moving all extremities X 4  Post vital signs: Reviewed and stable  Last Vitals:  Vitals Value Taken Time  BP 150/85 06/01/22 1036  Temp    Pulse 100 06/01/22 1037  Resp 19 06/01/22 1037  SpO2 100 % 06/01/22 1037  Vitals shown include unvalidated device data.  Last Pain:  Vitals:   06/01/22 0848  TempSrc: Oral  PainSc: 0-No pain         Complications: No notable events documented.

## 2022-06-01 NOTE — ED Notes (Signed)
7:40a [employer] @ [facility] will send truck could take 1hr to 1 1/2 for pick up.

## 2022-06-01 NOTE — ED Provider Notes (Signed)
[facility] EMERGENCY DEPT  Provider Note  CSN: [id number] Arrival date & time: 05/31/22 2140  History Chief Complaint  Patient presents with   Abdominal Pain    Shelby Harris is a 46 y.o. female with history of IBS and constipation reports LUQ pain for 2 days, associated with nausea and vomiting. No hematemesis. Poor appetite. She had a bowel movement yesterday which she states was black. No history of PUD. Has had c-section but no other abdominal surgeries. Scheduled for uterine ablation next week for DUB.    Home Medications Prior to Admission medications   Medication Sig Start Date End Date Taking? Authorizing Provider  furosemide (LASIX) 20 MG tablet Take 1 tablet (20 mg total) by mouth daily. 08/14/21   Nelwyn Salisbury, MD  ibuprofen (ADVIL,MOTRIN) 200 MG tablet Take 800 mg by mouth every 8 (eight) hours as needed. For pain    [provider]  loperamide (IMODIUM) 2 MG capsule Take 1-2 capsules (2-4 mg total) by mouth daily as needed for diarrhea or loose stools. 05/08/18   Arthor Captain, PA-C  Multiple Vitamins-Minerals (MULTIVITAMIN WITH MINERALS) tablet Take 1 tablet by mouth daily.    [provider]  polyethylene glycol (MIRALAX / GLYCOLAX) packet Take 17 g by mouth daily as needed for mild constipation.    [provider]  traMADol (ULTRAM) 50 MG tablet Take 1 tablet (50 mg total) by mouth every 6 (six) hours as needed for moderate pain. 08/14/21   Nelwyn Salisbury, MD  ferrous sulfate 300 (60 FE) MG/5ML syrup Take 300 mg by mouth daily.    09/12/11  [provider]     Allergies    Patient has no known allergies.   Review of Systems   Review of Systems Please see HPI for pertinent positives and negatives  Physical Exam BP (!) 162/91   Pulse 71   Temp 98.9 F (37.2 C) (Oral)   Resp 18   SpO2 100%   Physical Exam Vitals and nursing note reviewed.  Constitutional:      Appearance: Normal appearance.  HENT:      Head: Normocephalic and atraumatic.     Nose: Nose normal.     Mouth/Throat:     Mouth: Mucous membranes are moist.  Eyes:     Extraocular Movements: Extraocular movements intact.     Conjunctiva/sclera: Conjunctivae normal.  Cardiovascular:     Rate and Rhythm: Normal rate.  Pulmonary:     Effort: Pulmonary effort is normal.     Breath sounds: Normal breath sounds.  Abdominal:     General: Abdomen is flat.     Palpations: Abdomen is soft.     Tenderness: There is abdominal tenderness in the left upper quadrant. There is no guarding or rebound. Negative signs include Murphy's sign and McBurney's sign.  Musculoskeletal:        General: No swelling. Normal range of motion.     Cervical back: Neck supple.  Skin:    General: Skin is warm and dry.  Neurological:     General: No focal deficit present.     Mental Status: She is alert.  Psychiatric:        Mood and Affect: Mood normal.     ED Results / Procedures / Treatments   EKG None  Procedures Procedures  Medications Ordered in the ED Medications  cefTRIAXone (ROCEPHIN) 1 g in sodium chloride 0.9 % 100 mL IVPB (1 g Intravenous New Bag/Given 06/01/22 0318)  fentaNYL (  SUBLIMAZE) injection 50 mcg (50 mcg Intravenous Given 06/01/22 0103)  ondansetron (ZOFRAN) injection 4 mg (4 mg Intravenous Given 06/01/22 0103)  sodium chloride 0.9 % bolus 1,000 mL (0 mLs Intravenous Stopped 06/01/22 0244)  iohexol (OMNIPAQUE) 300 MG/ML solution 100 mL (80 mLs Intravenous Contrast Given 06/01/22 0140)  HYDROmorphone (DILAUDID) injection 1 mg (1 mg Intravenous Given 06/01/22 0318)    Initial Impression and Plan  Patient here with LUQ abdominal pain. Exam is without peritoneal signs. She states she feels a 'mass' in that area, but I cannot feel one on exam. She reports black stool recently but no hematemesis. Labs done in triage show CBC with anemia at baseline, normal WBC. CMP is normal. Lipase is mildly elevated, could represent mild pancreatitis.  Will check hemoccult and plan CT to further eval. Pain/nausea meds for comfort.   ED Course   Clinical Course as of 06/01/22 0325  Fri Jun 01, 2022  0157 Rectal; chaperone present. Stool soft and brown.  [CS]  [id number] I personally viewed the images from radiology studies and agree with radiologist interpretation: CT without signs of pancreatitis but there is gall bladder wall thickening. Will send for [place] to clarify. She still has mostly LUQ tenderness on exam but no also reports pain with palpation of epigastric and RUQ, although Murphy's sign remains negative.   [CS]  [id number] I personally viewed the images from radiology studies and agree with radiologist interpretation:  [place] confirms acute cholecystitis. I spoke with Dr. Redmond Pulling, Gen Surg who agrees with plan for Abx, pain control here. Plan to send to Pre-op in the AM.  [CS]    Clinical Course User Index [CS] Truddie Hidden, MD     MDM Rules/Calculators/A&P Medical Decision Making Problems Addressed: Acute cholecystitis: acute illness or injury  Amount and/or Complexity of Data Reviewed Labs: ordered. Decision-making details documented in ED Course. Radiology: ordered and independent interpretation performed. Decision-making details documented in ED Course.  Risk Prescription drug management. Parenteral controlled substances. Decision regarding hospitalization.    Final Clinical Impression(s) / ED Diagnoses Final diagnoses:  Acute cholecystitis    Rx / DC Orders ED Discharge Orders     None        Truddie Hidden, MD 06/01/22 [id number]

## 2022-06-01 NOTE — H&P (Signed)
Shelby Harris is an 46 y.o. female.   Chief Complaint: Abdominal pain HPI: Patient is a 46 year old female comes in secondary to abdominal pain.  She does have a history of hypertension hypothyroidism and IBS.  States that pain began yesterday.  She states this was after eating pistachios.  She states that she had nausea vomiting.  She states she had epigastric pain that radiated to the back.  States this was unrelenting.  She states she was headed out of town however did not continue with the pain and came to the ER.  Upon evaluation the ER she underwent CT scan, ultrasound and laboratory studies.  I did review these personally.  Ultrasound did show cholelithiasis as did CT scan.  Laboratory studies reveal slightly elevated lipase.  LFTs were within normal limits.  Patient had previous C-section x2.  Past Medical History:  Diagnosis Date   Anemia    Hypertension    Hyperthyroidism    with pregnancy   IBS (irritable bowel syndrome)     Past Surgical History:  Procedure Laterality Date   CESAREAN SECTION     TONSILLECTOMY     as child    Family History  Problem Relation Age of Onset   Diabetes Unknown    Social History:  reports that she has been smoking cigarettes. She has been smoking an average of 1 pack per day. She has never used smokeless tobacco. She reports that she does not drink alcohol and does not use drugs.  Allergies: No Known Allergies  Medications Prior to Admission  Medication Sig Dispense Refill   furosemide (LASIX) 20 MG tablet Take 1 tablet (20 mg total) by mouth daily. 30 tablet 3   ibuprofen (ADVIL,MOTRIN) 200 MG tablet Take 800 mg by mouth every 8 (eight) hours as needed. For pain     loperamide (IMODIUM) 2 MG capsule Take 1-2 capsules (2-4 mg total) by mouth daily as needed for diarrhea or loose stools. 10 capsule 0   Multiple Vitamins-Minerals (MULTIVITAMIN WITH MINERALS) tablet Take 1 tablet by mouth daily.     polyethylene glycol (MIRALAX /  GLYCOLAX) packet Take 17 g by mouth daily as needed for mild constipation.     traMADol (ULTRAM) 50 MG tablet Take 1 tablet (50 mg total) by mouth every 6 (six) hours as needed for moderate pain. 60 tablet 0    Results for orders placed or performed during the hospital encounter of 05/31/22 (from the past 48 hour(s))  Lipase, blood     Status: Abnormal   Collection Time: 05/31/22 10:15 PM  Result Value Ref Range   Lipase 93 (H) 11 - 51 U/L    Comment: Performed at [facility], [address] [place], [place] [address]  Comprehensive metabolic panel     Status: Abnormal   Collection Time: 05/31/22 10:15 PM  Result Value Ref Range   Sodium 136 135 - 145 mmol/L   Potassium 4.1 3.5 - 5.1 mmol/L   Chloride 103 98 - 111 mmol/L   CO2 23 22 - 32 mmol/L   Glucose, Bld 126 (H) 70 - 99 mg/dL    Comment: Glucose reference range applies only to samples taken after fasting for at least 8 hours.   BUN 7 6 - 20 mg/dL   Creatinine, Ser 6.04 0.44 - 1.00 mg/dL   Calcium 9.0 8.9 - 54.0 mg/dL   Total Protein 7.8 6.5 - 8.1 g/dL   Albumin 4.3 3.5 - 5.0 g/dL   AST 17 15 -  41 U/L   ALT 15 0 - 44 U/L   Alkaline Phosphatase 56 38 - 126 U/L   Total Bilirubin 0.4 0.3 - 1.2 mg/dL   GFR, Estimated >49 >44 mL/min    Comment: (NOTE) Calculated using the CKD-EPI Creatinine Equation (2021)    Anion gap 10 5 - 15    Comment: Performed at [facility], [address] [place], [place] [address]  CBC     Status: Abnormal   Collection Time: 05/31/22 10:15 PM  Result Value Ref Range   WBC 10.0 4.0 - 10.5 K/uL   RBC 4.17 3.87 - 5.11 MIL/uL   Hemoglobin 11.4 (L) 12.0 - 15.0 g/dL   HCT 16.3 84.6 - 65.9 %   MCV 87.3 80.0 - 100.0 fL   MCH 27.3 26.0 - 34.0 pg   MCHC 31.3 30.0 - 36.0 g/dL   RDW 93.5 70.1 - 77.9 %   Platelets 394 150 - 400 K/uL   nRBC 0.0 0.0 - 0.2 %    Comment: Performed at [facility], [address] [place], [place] [address]   Urinalysis, Routine w reflex microscopic Urine, Clean Catch     Status: Abnormal   Collection Time: 05/31/22 10:24 PM  Result Value Ref Range   Color, Urine YELLOW YELLOW   APPearance CLEAR CLEAR   Specific Gravity, Urine 1.034 (H) 1.005 - 1.030   pH 7.5 5.0 - 8.0   Glucose, UA NEGATIVE NEGATIVE mg/dL   Hgb urine dipstick SMALL (A) NEGATIVE   Bilirubin Urine NEGATIVE NEGATIVE   Ketones, ur NEGATIVE NEGATIVE mg/dL   Protein, ur 30 (A) NEGATIVE mg/dL   Nitrite NEGATIVE NEGATIVE   Leukocytes,Ua NEGATIVE NEGATIVE   RBC / HPF 11-20 0 - 5 RBC/hpf   WBC, UA 0-5 0 - 5 WBC/hpf   Bacteria, UA FEW (A) NONE SEEN   Squamous Epithelial / LPF 6-10 0 - 5   Mucus PRESENT     Comment: Performed at [facility], [address] [place], [place] [address]  Pregnancy, urine     Status: None   Collection Time: 05/31/22 10:24 PM  Result Value Ref Range   Preg Test, Ur NEGATIVE NEGATIVE    Comment:        THE SENSITIVITY OF THIS METHODOLOGY IS >20 mIU/mL. Performed at [facility], [address], [place], [place] [address]   Occult blood card to lab, stool     Status: None   Collection Time: 06/01/22 12:52 AM  Result Value Ref Range   Fecal Occult Bld NEGATIVE NEGATIVE    Comment: Performed at [facility], [address], [place], [place] [address]   US Abdomen Limited RUQ (LIVER/GB)  Result Date: 06/01/2022 CLINICAL DATA:  Gallbladder wall thickening on recent CT EXAM: ULTRASOUND ABDOMEN LIMITED RIGHT UPPER QUADRANT COMPARISON:  CT from earlier in the same day. FINDINGS: Gallbladder: Gallbladder is well distended. Wall thickening to 8.5 mm is noted. Non mobile gallstone is noted in the region of the gallbladder neck. Pericholecystic fluid is noted. Positive sonographic Murphy's sign is elicited. Common bile duct: Diameter: 1 mm. Liver: Five portal vein is patent on color Doppler imaging with normal direction of blood flow towards the  liver. Other: None. IMPRESSION: Changes consistent with acute calculous cholecystitis. Electronically Signed   By: Alcide Clever M.D.   On: 06/01/2022 02:51   CT Abdomen Pelvis W Contrast  Result Date: 06/01/2022 CLINICAL DATA:  Left-sided abdominal pain and elevated lipase EXAM: CT ABDOMEN  AND PELVIS WITH CONTRAST TECHNIQUE: Multidetector CT imaging of the abdomen and pelvis was performed using the standard protocol following bolus administration of intravenous contrast. RADIATION DOSE REDUCTION: This exam was performed according to the departmental dose-optimization program which includes automated exposure control, adjustment of the mA and/or kV according to patient size and/or use of iterative reconstruction technique. CONTRAST:  80mL OMNIPAQUE IOHEXOL 300 MG/ML  SOLN COMPARISON:  None Available. FINDINGS: Lower chest: No acute abnormality. Hepatobiliary: Liver is within normal limits. Gallbladder wall thickening is seen. No definitive cholelithiasis is noted. No biliary ductal dilatation is noted. Pancreas: Unremarkable. No pancreatic ductal dilatation or surrounding inflammatory changes. Spleen: Normal in size without focal abnormality. Adrenals/Urinary Tract: Adrenal glands are within normal limits. Kidneys demonstrate a normal enhancement pattern bilaterally. No renal calculi or obstructive changes are seen. The bladder is decompressed. Stomach/Bowel: No obstructive or inflammatory changes of the colon are seen. The appendix is within normal limits. Small bowel and stomach are unremarkable. Vascular/Lymphatic: No significant vascular findings are present. No enlarged abdominal or pelvic lymph nodes. Reproductive: Uterus and bilateral adnexa are unremarkable. Other: No abdominal wall hernia or abnormality. No abdominopelvic ascites. Musculoskeletal: No acute or significant osseous findings. IMPRESSION: No evidence of pancreatitis. Gallbladder wall thickening without definitive cholelithiasis. Ultrasound of  the right upper quadrant may be helpful for further evaluation. Electronically Signed   By: Alcide CleverMark  Lukens M.D.   On: 06/01/2022 02:04    Review of Systems  Constitutional: Negative.   HENT: Negative.    Respiratory: Negative.    Cardiovascular: Negative.   Gastrointestinal:  Positive for abdominal pain, nausea and vomiting.  Neurological: Negative.   All other systems reviewed and are negative.   Blood pressure (!) 146/88, pulse 80, temperature 98.5 F (36.9 C), temperature source Oral, resp. rate 18, height 5\' 4"  (1.626 m), weight 81.6 kg, SpO2 98 %. Physical Exam Constitutional:      Appearance: She is well-developed.     Comments: Conversant No acute distress  HENT:     Head: Normocephalic and atraumatic.  Eyes:     General: Lids are normal. No scleral icterus.    Pupils: Pupils are equal, round, and reactive to light.     Comments: Pupils are equal round and reactive No lid lag Moist conjunctiva  Neck:     Thyroid: No thyromegaly.     Trachea: No tracheal tenderness.     Comments: No cervical lymphadenopathy Cardiovascular:     Rate and Rhythm: Normal rate and regular rhythm.     Heart sounds: No murmur heard. Pulmonary:     Effort: Pulmonary effort is normal.     Breath sounds: Normal breath sounds. No wheezing or rales.  Abdominal:     Tenderness: There is abdominal tenderness. There is no guarding or rebound.     Hernia: No hernia is present.  Musculoskeletal:     Cervical back: Normal range of motion and neck supple.  Skin:    General: Skin is warm.     Findings: No rash.     Nails: There is no clubbing.     Comments: Normal skin turgor  Neurological:     Mental Status: She is alert and oriented to person, place, and time.     Comments: Normal gait and station  Psychiatric:        Mood and Affect: Mood normal.        Thought Content: Thought content normal.        Judgment: Judgment normal.  Comments: Appropriate affect       Assessment/Plan 46 year old female with cholecystitis, pancreatitis, likely secondary to gallstones.  1.  We will proceed to the operating for lap chole with IOC. 2. All risks and benefits were discussed with the patient to generally include: infection, bleeding, possible need for post op ERCP, damage to the bile ducts, and bile leak. Alternatives were offered and described.  All questions were answered and the patient voiced understanding of the procedure and wishes to proceed at this point with a laparoscopic cholecystectomy   Axel Filler, MD 06/01/2022, 8:49 AM

## 2022-06-01 NOTE — Anesthesia Preprocedure Evaluation (Addendum)
Anesthesia Evaluation  Patient identified by MRN, date of birth, ID band Patient awake    Reviewed: Allergy & Precautions, NPO status , Patient's Chart, lab work & pertinent test results  Airway Mallampati: III  TM Distance: >3 FB Neck ROM: Full    Dental  (+) Chipped   Pulmonary Current Smoker and Patient abstained from smoking.   Pulmonary exam normal        Cardiovascular hypertension, Normal cardiovascular exam     Neuro/Psych negative neurological ROS  negative psych ROS   GI/Hepatic Neg liver ROS,,,IBS (irritable bowel syndrome)   Endo/Other  negative endocrine ROS    Renal/GU negative Renal ROS     Musculoskeletal negative musculoskeletal ROS (+)    Abdominal   Peds  Hematology negative hematology ROS (+)   Anesthesia Other Findings acute choelcystitis  Reproductive/Obstetrics Hcg negative                             Anesthesia Physical Anesthesia Plan  ASA: 2  Anesthesia Plan: General   Post-op Pain Management:    Induction: Intravenous  PONV Risk Score and Plan: 3 and Ondansetron, Dexamethasone, Midazolam, Scopolamine patch - Pre-op and Treatment may vary due to age or medical condition  Airway Management Planned: Oral ETT  Additional Equipment:   Intra-op Plan:   Post-operative Plan: Extubation in OR  Informed Consent: I have reviewed the patients History and Physical, chart, labs and discussed the procedure including the risks, benefits and alternatives for the proposed anesthesia with the patient or authorized representative who has indicated his/her understanding and acceptance.     Dental advisory given  Plan Discussed with: CRNA  Anesthesia Plan Comments:        Anesthesia Quick Evaluation

## 2022-06-01 NOTE — Anesthesia Procedure Notes (Signed)
Procedure Name: Intubation Date/Time: 06/01/2022 9:20 AM  Performed by: Nils Pyle, CRNAPre-anesthesia Checklist: Patient identified, Emergency Drugs available, Suction available and Patient being monitored Patient Re-evaluated:Patient Re-evaluated prior to induction Oxygen Delivery Method: Circle System Utilized Preoxygenation: Pre-oxygenation with 100% oxygen Induction Type: IV induction and Rapid sequence Laryngoscope Size: Miller and 2 Grade View: Grade I Tube type: Oral Tube size: 7.0 mm Number of attempts: 1 Airway Equipment and Method: Stylet and Oral airway Placement Confirmation: ETT inserted through vocal cords under direct vision, positive ETCO2 and breath sounds checked- equal and bilateral Secured at: 23 cm Tube secured with: Tape Dental Injury: Teeth and Oropharynx as per pre-operative assessment

## 2022-06-01 NOTE — Op Note (Signed)
06/01/2022  10:18 AM  PATIENT:  Shelby Harris  46 y.o. female  PRE-OPERATIVE DIAGNOSIS:  acute cholecystitis  POST-OPERATIVE DIAGNOSIS:  acute cholecystitis  PROCEDURE:  Procedure(s): LAPAROSCOPIC CHOLECYSTECTOMY WITH INTRAOPERATIVE CHOLANGIOGRAM (N/A)  SURGEON:  Surgeon(s) and Role:    * Axel Filler, MD - Primary  ANESTHESIA:   local and general  EBL:  10 mL   BLOOD ADMINISTERED:none  DRAINS: none   LOCAL MEDICATIONS USED:  BUPIVICAINE   SPECIMEN:  Source of Specimen:  gallbladder  DISPOSITION OF SPECIMEN:  PATHOLOGY  COUNTS:  YES  TOURNIQUET:  * No tourniquets in log *  DICTATION: .Dragon Dictation The patient was taken to the operating and placed in the supine position with bilateral SCDs in place.  The patient was prepped and draped in the usual sterile fashion. A time out was called and all facts were verified. A pneumoperitoneum was obtained via A Veress needle technique to a pressure of 60mm of mercury.  A 64mm trochar was then placed in the right upper quadrant under visualization, and there were no injuries to any abdominal organs. A 11 mm port was then placed in the umbilical region after infiltrating with local anesthesia under direct visualization. A second and third epigastric port and right lower quadrant port placement under direct visualization, respectively.     The gallbladder was identified and retracted, the peritoneum was then sharply dissected from the gallbladder and this dissection was carried down to Calot's triangle. The cystic duct was identified and stripped away circumferentially and seen going into the gallbladder 360, the critical angle was obtained. A Cook catheter was used to perform an intraoperative cholangiogram. The cholangiogram showed no filling defects and the contrast emptied into the duodenum easily.  The hepatic ducts were seen to be free of any filling defects. 2 clips were placed proximally one distally and the cystic duct  transected. The cystic artery was identified and 2 clips placed proximally and one distally and transected.    We then proceeded to remove the gallbladder off the hepatic fossa with Bovie cautery. A retrieval bag was then placed in the abdomen and gallbladder placed in the bag. The hepatic fossa was then reexamined and hemostasis was achieved with Bovie cautery and was excellent at the end of the case. The subhepatic fossa and perihepatic fossa was then irrigated until the effluent was clear. The 11 mm trocar fascia was reapproximated with the PMI & a #1 Vicryl x2.  The pneumoperitoneum was evacuated and all trochars removed under direct visulalization.  The skin was then closed with 4-0 Monocryl and the skin dressed with Dermabond.  The patient was awaken from general anesthesia and taken to the recovery room in stable condition.    PLAN OF CARE: Discharge to home after PACU  PATIENT DISPOSITION:  PACU - hemodynamically stable.   Delay start of Pharmacological VTE agent (>24hrs) due to surgical blood loss or risk of bleeding: not applicable

## 2022-06-02 NOTE — Anesthesia Postprocedure Evaluation (Signed)
Anesthesia Post Note  Patient: Shelby Harris  Procedure(s) Performed: LAPAROSCOPIC CHOLECYSTECTOMY WITH INTRAOPERATIVE CHOLANGIOGRAM     Patient location during evaluation: PACU Anesthesia Type: General Level of consciousness: awake Pain management: pain level controlled Vital Signs Assessment: post-procedure vital signs reviewed and stable Respiratory status: spontaneous breathing, nonlabored ventilation and respiratory function stable Cardiovascular status: blood pressure returned to baseline and stable Postop Assessment: no apparent nausea or vomiting Anesthetic complications: no  No notable events documented.  Last Vitals:  Vitals:   06/01/22 1105 06/01/22 1120  BP: (!) 169/88 (!) 169/95  Pulse: 81 82  Resp: 14 19  Temp:  37.1 C  SpO2: 96% 97%    Last Pain:  Vitals:   06/01/22 1120  TempSrc:   PainSc: 5                  Nils Thor P Alvah Lagrow

## 2022-06-03 ENCOUNTER — Encounter (HOSPITAL_COMMUNITY): Payer: Self-pay | Admitting: General Surgery

## 2022-06-04 LAB — SURGICAL PATHOLOGY

## 2022-11-02 ENCOUNTER — Encounter: Payer: Self-pay | Admitting: Family Medicine

## 2022-11-02 ENCOUNTER — Ambulatory Visit (INDEPENDENT_AMBULATORY_CARE_PROVIDER_SITE_OTHER): Payer: BC Managed Care – PPO | Admitting: Family Medicine

## 2022-11-02 ENCOUNTER — Ambulatory Visit (INDEPENDENT_AMBULATORY_CARE_PROVIDER_SITE_OTHER): Payer: BC Managed Care – PPO

## 2022-11-02 VITALS — BP 132/84 | HR 86 | Temp 98.4°F | Wt 194.0 lb

## 2022-11-02 DIAGNOSIS — M545 Low back pain, unspecified: Secondary | ICD-10-CM

## 2022-11-02 DIAGNOSIS — B351 Tinea unguium: Secondary | ICD-10-CM | POA: Diagnosis not present

## 2022-11-02 MED ORDER — FUROSEMIDE 20 MG PO TABS: 20.0000 mg | ORAL_TABLET | Freq: Every day | ORAL | 3 refills | Status: DC

## 2022-11-02 MED ORDER — METHYLPREDNISOLONE 4 MG PO TBPK: ORAL_TABLET | ORAL | 0 refills | Status: DC

## 2022-11-02 MED ORDER — TERBINAFINE HCL 250 MG PO TABS: 250.0000 mg | ORAL_TABLET | Freq: Every day | ORAL | 1 refills | Status: DC

## 2022-11-02 MED ORDER — CYCLOBENZAPRINE HCL 10 MG PO TABS: 10.0000 mg | ORAL_TABLET | Freq: Three times a day (TID) | ORAL | 2 refills | Status: DC | PRN

## 2022-11-02 NOTE — Progress Notes (Signed)
   Subjective:    Patient ID: Shelby Harris, female    DOB: 05-13-1976, 47 y.o.   MRN: [id number]  HPI Here for several issues. First she has had pain in the lower back on the right side for the past week. The pain radiated down the right leg for a day or two, but not now. No numbness or tingling. No hx of trauma. She has tried applying heat and ice, and she has taken Ibuprofen and Tramadol with no relief. The other issue is all her toenails have turned dark, they are thick, and they break off in little pieces.    Review of Systems  Constitutional: Negative.   Respiratory: Negative.    Cardiovascular: Negative.   Musculoskeletal:  Positive for back pain.       Objective:   Physical Exam Constitutional:      General: She is not in acute distress.    Appearance: Normal appearance.  Cardiovascular:     Rate and Rhythm: Normal rate and regular rhythm.     Pulses: Normal pulses.     Heart sounds: Normal heart sounds.  Pulmonary:     Effort: Pulmonary effort is normal.     Breath sounds: Normal breath sounds.  Musculoskeletal:     Comments: She is tender in the right lower back. ROM is full, but she has pain on full flexion. SLR are negative.   Skin:    Comments: All ten toenails show fungal involvement. They are dark, curled and crusty   Neurological:     Mental Status: She is alert.           Assessment & Plan:  Low back pain. We will get Xrays of the lumbar spine. She will try Flexeril and a Medrol dose pack. For the toenail fungus, she will try Terbinafine for 90 days. Gershon Crane, MD

## 2023-01-14 ENCOUNTER — Ambulatory Visit (HOSPITAL_COMMUNITY)
Admission: EM | Admit: 2023-01-14 | Discharge: 2023-01-14 | Disposition: A | Discharge status: Home / Self Care | Payer: BC Managed Care – PPO

## 2023-01-14 ENCOUNTER — Emergency Department (HOSPITAL_COMMUNITY)
Admission: EM | Admit: 2023-01-14 | Discharge: 2023-01-14 | Disposition: A | Discharge status: Home / Self Care | Payer: BC Managed Care – PPO | Attending: Emergency Medicine | Admitting: Emergency Medicine

## 2023-01-14 ENCOUNTER — Other Ambulatory Visit: Payer: Self-pay

## 2023-01-14 ENCOUNTER — Encounter (HOSPITAL_COMMUNITY): Payer: Self-pay

## 2023-01-14 DIAGNOSIS — L731 Pseudofolliculitis barbae: Secondary | ICD-10-CM | POA: Diagnosis present

## 2023-01-14 DIAGNOSIS — L0291 Cutaneous abscess, unspecified: Secondary | ICD-10-CM

## 2023-01-14 DIAGNOSIS — I10 Essential (primary) hypertension: Secondary | ICD-10-CM | POA: Insufficient documentation

## 2023-01-14 DIAGNOSIS — L739 Follicular disorder, unspecified: Secondary | ICD-10-CM | POA: Diagnosis not present

## 2023-01-14 MED ORDER — DOXYCYCLINE HYCLATE 100 MG PO CAPS: 100.0000 mg | ORAL_CAPSULE | Freq: Two times a day (BID) | ORAL | 0 refills | Status: AC

## 2023-01-14 NOTE — ED Notes (Signed)
Pt reports the abscess is on the pelvis area around outer labia.  She states that the abscess has been there "around 6 months." Pt states that she has taken a course of antibiotics months ago but did not clear up.  No fever or chills reported.  Ongoing for months.

## 2023-01-14 NOTE — Discharge Instructions (Addendum)
It was a pleasure caring for you today.  Ultrasound showed superficial 0.5 cm fluid collection.  Please take entire course of doxycycline even if symptoms resolve.  Seek emergency care if experiencing any new or worsening symptoms.

## 2023-01-14 NOTE — ED Triage Notes (Signed)
Pt is here for a vaginal abscess x 1-2 months. Pt reports some drainage from the abscess.

## 2023-01-14 NOTE — Discharge Instructions (Addendum)
Your symptoms are due to skin infection called folliculitis (infected hair follicle).   Take doxycycline antibiotic twice daily for the next 7 days.  Continue warm compresses to help reduce infection.  Follow-up with OB/GYN.  If you develop any new or worsening symptoms or if your symptoms do not start to improve, pleases return here or follow-up with your primary care provider. If your symptoms are severe, please go to the emergency room.

## 2023-01-14 NOTE — ED Triage Notes (Signed)
Patient sent from [facility] for eval of suspected vaginal abscess. Has been present x 2 months but now getting more irritating and is draining blood today. They sent rx for abx today but patient is requesting it to be lanced so she was directed to the ED.

## 2023-01-14 NOTE — ED Provider Notes (Signed)
[place] EMERGENCY DEPARTMENT AT [facility] Provider Note   CSN: [id number] Arrival date & time: 01/14/23  1234     History  No chief complaint on file.   Shelby Harris is a 47 y.o. female with PMHx HTN who presents to ED concerned for possible lower abdominal abscess. Area started as ingrown hair 1-2 months ago. Started draining yesterday. Went to UC earlier today and was discharged with doxy. No I&D d/t size and already draining. Arriving to ED specifically requesting for entire abscess to be removed.  Denies fever, chills, chest pain, cough, dyspnea, abdominal pain, nausea, vomiting.  HPI     Home Medications Prior to Admission medications   Medication Sig Start Date End Date Taking? Authorizing Provider  cyclobenzaprine (FLEXERIL) 10 MG tablet Take 1 tablet (10 mg total) by mouth 3 (three) times daily as needed for muscle spasms. 11/02/22   Nelwyn Salisbury, MD  doxycycline (VIBRAMYCIN) 100 MG capsule Take 1 capsule (100 mg total) by mouth 2 (two) times daily for 7 days. 01/14/23 01/21/23  Carlisle Beers, FNP  furosemide (LASIX) 20 MG tablet Take 1 tablet (20 mg total) by mouth daily. 11/02/22   Nelwyn Salisbury, MD  furosemide (LASIX) 20 MG tablet Take 1 tablet by mouth daily.    [provider]  ibuprofen (ADVIL,MOTRIN) 200 MG tablet Take 800 mg by mouth every 8 (eight) hours as needed. For pain    [provider]  loperamide (IMODIUM) 2 MG capsule Take 1-2 capsules (2-4 mg total) by mouth daily as needed for diarrhea or loose stools. 05/08/18   Arthor Captain, PA-C  methylPREDNISolone (MEDROL DOSEPAK) 4 MG TBPK tablet As directed 11/02/22   Nelwyn Salisbury, MD  Multiple Vitamins-Minerals (MULTIVITAMIN WITH MINERALS) tablet Take 1 tablet by mouth daily.    [provider]  norethindrone (AYGESTIN) 5 MG tablet Take 5 mg by mouth daily. 10/29/22   [provider]  polyethylene glycol (MIRALAX / GLYCOLAX) packet Take 17 g by mouth daily  as needed for mild constipation.    [provider]  terbinafine (LAMISIL) 250 MG tablet Take 1 tablet (250 mg total) by mouth daily. 11/02/22   Nelwyn Salisbury, MD  traMADol (ULTRAM) 50 MG tablet Take 1 tablet (50 mg total) by mouth every 6 (six) hours as needed for severe pain. 06/01/22   Meuth, Brooke A, PA-C  ferrous sulfate 300 (60 FE) MG/5ML syrup Take 300 mg by mouth daily.    09/12/11  [provider]      Allergies    Patient has no known allergies.    Review of Systems   Review of Systems  Skin:        infection    Physical Exam Updated Vital Signs BP 133/89   Pulse 91   Temp 98 F (36.7 C) (Temporal)   Resp 18   Ht 5\' 4"  (1.626 m)   Wt 90.7 kg   LMP 01/09/2023 (Approximate)   SpO2 100%   BMI 34.33 kg/m  Physical Exam Vitals and nursing note reviewed.  Constitutional:      General: She is not in acute distress.    Appearance: She is not ill-appearing or toxic-appearing.  HENT:     Head: Normocephalic and atraumatic.     Mouth/Throat:     Mouth: Mucous membranes are moist.  Eyes:     General: No scleral icterus.       Right eye: No discharge.  Left eye: No discharge.     Conjunctiva/sclera: Conjunctivae normal.  Cardiovascular:     Rate and Rhythm: Normal rate and regular rhythm.     Pulses: Normal pulses.     Heart sounds: Normal heart sounds. No murmur heard. Pulmonary:     Effort: Pulmonary effort is normal. No respiratory distress.     Breath sounds: Normal breath sounds. No wheezing, rhonchi or rales.  Abdominal:     General: Abdomen is flat. Bowel sounds are normal. There is no distension.     Palpations: Abdomen is soft. There is no mass.     Tenderness: There is no abdominal tenderness.  Musculoskeletal:     Right lower leg: No edema.     Left lower leg: No edema.  Skin:    General: Skin is warm and dry.     Findings: No rash.     Comments: 0.5-1cm area of underlying soft tissue swelling mild fluctuance.   Neurological:      General: No focal deficit present.     Mental Status: She is alert. Mental status is at baseline.  Psychiatric:        Mood and Affect: Mood normal.     ED Results / Procedures / Treatments   Labs (all labs ordered are listed, but only abnormal results are displayed) Labs Reviewed - No data to display  EKG None  Radiology No results found.  Procedures Procedures    Medications Ordered in ED Medications - No data to display  ED Course/ Medical Decision Making/ A&P                             Medical Decision Making  This patient presents to the ED for concern of ingrown hair, this involves an extensive number of treatment options, and is a complaint that carries with it a high risk of complications and morbidity.  The differential diagnosis includes n/v compromise, cellulitis, abscess, sepsis   Co morbidities that complicate the patient evaluation  HTN   Additional history obtained:  Additional history obtained from 01/14/23 UC note - discharged with doxycycline   Problem List / ED Course / Critical interventions / Medication management  Patient presents to ED concerned for ingrown hair that has been present for 1-2 months and started draining yesterday. Physical exam concerning for <1cm area of soft tissue swelling and mild fluctuance. Bedside US showing 0.5cm superficial fluid collection. Shared with patient that skin infection of this size that is already draining is best treated with oral ABX. Patient verbally endorsed understanding of plan. Answered patient's questions and provided with return precautions. Patient endorses being able to pick up doxycycline from pharmacy that she was prescribed earlier today. Denies wanting first dose of doxy in ED.  I have reviewed the patients home medicines and have made adjustments as needed Patient afebrile with stable vitals. Patient discharged in good condition.   Social Determinants of  Health:  none           Final Clinical Impression(s) / ED Diagnoses Final diagnoses:  Abscess    Rx / DC Orders ED Discharge Orders     None         Dorthy Cooler, [place] 01/17/23 1337    Terald Sleeper, MD 01/17/23 1523

## 2023-01-14 NOTE — ED Provider Notes (Addendum)
[facility]    CSN: [id number] Arrival date & time: 01/14/23  0913      History   Chief Complaint Chief Complaint  Patient presents with   Abscess    HPI Shelby Harris is a 47 y.o. female.   Patient presents to urgent care for evaluation of suspected vaginal abscess that she first noticed 1-2 months ago but started draining yesterday.  She has difficulty describing drainage from site and states that appears to be "an infected hair bump".  Abscess is to the suprapubic region and is tender to touch.  No recent fevers or chills.  States she has been seen for this in the past a couple of years ago where she was placed on an antibiotic which helped with symptoms.  Denies chance of pregnancy, last withdrawal bleed was 01/09/2023.  Takes oral contraceptives.  Denies recent antibiotic/steroid use.  No history of immunosuppression.  Has been using warm compresses which have helped to soften the abscess.   Abscess   Past Medical History:  Diagnosis Date   Anemia    Hypertension    Hyperthyroidism    with pregnancy   IBS (irritable bowel syndrome)     Patient Active Problem List   Diagnosis Date Noted   Hyperthyroidism complicating pregnancy 09/14/2021   HTN (hypertension) 09/14/2021   Anemia affecting pregnancy 09/14/2021   Constipation 09/14/2021   Bilateral leg edema 08/15/2021    Past Surgical History:  Procedure Laterality Date   CESAREAN SECTION     CHOLECYSTECTOMY N/A 06/01/2022   Procedure: LAPAROSCOPIC CHOLECYSTECTOMY WITH INTRAOPERATIVE CHOLANGIOGRAM;  Surgeon: Axel Filler, MD;  Location: [facility] OR;  Service: General;  Laterality: N/A;   TONSILLECTOMY     as child    OB History     Gravida  2   Para  2   Term  2   Preterm  0   AB  0   Living  2      SAB  0   IAB  0   Ectopic  0   Multiple  0   Live Births  2            Home Medications    Prior to Admission medications   Medication Sig Start Date End Date Taking?  Authorizing Provider  doxycycline (VIBRAMYCIN) 100 MG capsule Take 1 capsule (100 mg total) by mouth 2 (two) times daily for 7 days. 01/14/23 01/21/23 Yes StanhopeDonavan Burnet, FNP  furosemide (LASIX) 20 MG tablet Take 1 tablet (20 mg total) by mouth daily. 11/02/22  Yes Nelwyn Salisbury, MD  loperamide (IMODIUM) 2 MG capsule Take 1-2 capsules (2-4 mg total) by mouth daily as needed for diarrhea or loose stools. 05/08/18  Yes Arthor Captain, PA-C  methylPREDNISolone (MEDROL DOSEPAK) 4 MG TBPK tablet As directed 11/02/22  Yes Nelwyn Salisbury, MD  polyethylene glycol (MIRALAX / GLYCOLAX) packet Take 17 g by mouth daily as needed for mild constipation.   Yes [provider]  terbinafine (LAMISIL) 250 MG tablet Take 1 tablet (250 mg total) by mouth daily. 11/02/22  Yes Nelwyn Salisbury, MD  traMADol (ULTRAM) 50 MG tablet Take 1 tablet (50 mg total) by mouth every 6 (six) hours as needed for severe pain. 06/01/22  Yes Meuth, Brooke A, PA-C  cyclobenzaprine (FLEXERIL) 10 MG tablet Take 1 tablet (10 mg total) by mouth 3 (three) times daily as needed for muscle spasms. 11/02/22   Nelwyn Salisbury, MD  furosemide (LASIX) 20 MG  tablet Take 1 tablet by mouth daily.    [provider]  ibuprofen (ADVIL,MOTRIN) 200 MG tablet Take 800 mg by mouth every 8 (eight) hours as needed. For pain    [provider]  Multiple Vitamins-Minerals (MULTIVITAMIN WITH MINERALS) tablet Take 1 tablet by mouth daily.    [provider]  norethindrone (AYGESTIN) 5 MG tablet Take 5 mg by mouth daily. 10/29/22   [provider]  ferrous sulfate 300 (60 FE) MG/5ML syrup Take 300 mg by mouth daily.    09/12/11  [provider]    Family History Family History  Problem Relation Age of Onset   Diabetes Unknown     Social History Social History   Tobacco Use   Smoking status: Every Day    Current packs/day: 1.00    Types: Cigarettes   Smokeless tobacco: Never   Tobacco comments:    1/2 or  less  Vaping Use   Vaping status: Never Used  Substance Use Topics   Alcohol use: No   Drug use: No     Allergies   Patient has no known allergies.   Review of Systems Review of Systems Per HPI  Physical Exam Triage Vital Signs ED Triage Vitals [01/14/23 0936]  Encounter Vitals Group     BP (!) 143/83     Systolic BP Percentile      Diastolic BP Percentile      Pulse Rate (!) 103     Resp 16     Temp 98.8 F (37.1 C)     Temp Source Oral     SpO2 96 %     Weight      Height      Head Circumference      Peak Flow      Pain Score      Pain Loc      Pain Education      Exclude from Growth Chart    No data found.  Updated Vital Signs BP (!) 143/83 (BP Location: Left Arm)   Pulse (!) 103   Temp 98.8 F (37.1 C) (Oral)   Resp 16   LMP 01/09/2023 (Approximate)   SpO2 96%   Visual Acuity Right Eye Distance:   Left Eye Distance:   Bilateral Distance:    Right Eye Near:   Left Eye Near:    Bilateral Near:     Physical Exam Vitals and nursing note reviewed. Exam conducted with a chaperone present [facility], CMA).  Constitutional:      Appearance: She is not ill-appearing or toxic-appearing.  HENT:     Head: Normocephalic and atraumatic.     Right Ear: Hearing and external ear normal.     Left Ear: Hearing and external ear normal.     Nose: Nose normal.     Mouth/Throat:     Lips: Pink.  Eyes:     General: Lids are normal. Vision grossly intact. Gaze aligned appropriately.     Extraocular Movements: Extraocular movements intact.     Conjunctiva/sclera: Conjunctivae normal.  Pulmonary:     Effort: Pulmonary effort is normal.  Genitourinary:    General: Normal vulva.  Musculoskeletal:     Cervical back: Neck supple.  Skin:    General: Skin is warm and dry.     Capillary Refill: Capillary refill takes less than 2 seconds.     Findings: Abscess (Approximately 0.5 cm area of underlying soft tissue swelling and induration with superficial mild fluctuance,  tender  to palpation) present. No rash.       Neurological:     General: No focal deficit present.     Mental Status: She is alert and oriented to person, place, and time. Mental status is at baseline.     Cranial Nerves: No dysarthria or facial asymmetry.  Psychiatric:        Mood and Affect: Mood normal.        Speech: Speech normal.        Behavior: Behavior normal.        Thought Content: Thought content normal.        Judgment: Judgment normal.      UC Treatments / Results  Labs (all labs ordered are listed, but only abnormal results are displayed) Labs Reviewed - No data to display  EKG   Radiology No results found.  Procedures Procedures (including critical care time)  Medications Ordered in UC Medications - No data to display  Initial Impression / Assessment and Plan / UC Course  I have reviewed the triage vital signs and the nursing notes.  Pertinent labs & imaging results that were available during my care of the patient were reviewed by me and considered in my medical decision making (see chart for details).   1.  Acute folliculitis Presentation consistent with acute folliculitis etiology.  Doxycycline sent to be taken as prescribed.  Warm compresses recommended.  Patient is requesting incision and drainage today as well as excision of abscess/cyst.  There is no indication for this today as I feel I will not be able to drain very much from the abscess. She may continue warm compresses and OTC medicines as needed for pain associated with infection. Follow-up with OB/GYN recommended as needed.  Counseled patient on potential for adverse effects with medications prescribed/recommended today, strict ER and return-to-clinic precautions discussed, patient verbalized understanding.    Final Clinical Impressions(s) / UC Diagnoses   Final diagnoses:  Acute folliculitis     Discharge Instructions      Your symptoms are due to skin infection called folliculitis  (infected hair follicle).   Take doxycycline antibiotic twice daily for the next 7 days.  Continue warm compresses to help reduce infection.  Follow-up with OB/GYN.  If you develop any new or worsening symptoms or if your symptoms do not start to improve, pleases return here or follow-up with your primary care provider. If your symptoms are severe, please go to the emergency room.     ED Prescriptions     Medication Sig Dispense Auth. Provider   doxycycline (VIBRAMYCIN) 100 MG capsule Take 1 capsule (100 mg total) by mouth 2 (two) times daily for 7 days. 14 capsule Carlisle Beers, FNP      PDMP not reviewed this encounter.   Carlisle Beers, FNP 01/14/23 1038    Reita May [place], [place] 01/14/23 1038

## 2023-01-14 NOTE — ED Notes (Signed)
Awaiting patient from lobby 

## 2023-10-10 ENCOUNTER — Encounter: Payer: Self-pay | Admitting: Advanced Practice Midwife

## 2023-10-10 ENCOUNTER — Ambulatory Visit (INDEPENDENT_AMBULATORY_CARE_PROVIDER_SITE_OTHER): Admitting: Advanced Practice Midwife

## 2023-10-10 ENCOUNTER — Other Ambulatory Visit: Payer: Self-pay

## 2023-10-10 ENCOUNTER — Other Ambulatory Visit (HOSPITAL_COMMUNITY)
Admission: RE | Admit: 2023-10-10 | Discharge: 2023-10-10 | Disposition: A | Discharge status: Home / Self Care | Source: Ambulatory Visit | Attending: Advanced Practice Midwife | Admitting: Advanced Practice Midwife

## 2023-10-10 VITALS — BP 155/93 | HR 76 | Ht 62.0 in | Wt 201.0 lb

## 2023-10-10 DIAGNOSIS — Z01419 Encounter for gynecological examination (general) (routine) without abnormal findings: Secondary | ICD-10-CM

## 2023-10-10 DIAGNOSIS — Z124 Encounter for screening for malignant neoplasm of cervix: Secondary | ICD-10-CM | POA: Diagnosis present

## 2023-10-10 DIAGNOSIS — N939 Abnormal uterine and vaginal bleeding, unspecified: Secondary | ICD-10-CM

## 2023-10-10 DIAGNOSIS — Z1231 Encounter for screening mammogram for malignant neoplasm of breast: Secondary | ICD-10-CM

## 2023-10-10 DIAGNOSIS — N6452 Nipple discharge: Secondary | ICD-10-CM

## 2023-10-10 NOTE — Progress Notes (Signed)
 GYNECOLOGY ANNUAL PREVENTATIVE CARE ENCOUNTER NOTE  History:     Shelby Harris is a 48 y.o. [id number] female  Discussed the use of AI scribe software for clinical note transcription with the patient, who gave verbal consent to proceed.  History of Present Illness   The patient, with a history of heavy menstrual bleeding managed with Aygestin, presents for an annual gynecology appointment. The patient reports that her periods have been "pretty good" and "easy" since starting Aygestin. However, she describes her periods as "iffy," lasting about five days with a medium amount of bleeding. The patient also reports recent nipple leakage, which started about a month ago. The patient denies any breast lumps, puckering, or dimpling. 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   Denies abnormal discharge, pelvic pain, problems with intercourse or other gynecologic concerns.     Gynecologic History Patient's last menstrual period was 09/30/2023 (exact date). Contraception: tubal ligation Last Pap: uncertain. Results were: normal with negative HPV pre pt Last mammogram: None.      Know Dx HTN. Has not taken Lasix for the past few days due to having dental procedure.   Obstetric History OB History  Gravida Para Term Preterm AB Living  2 2 2  0 0 2  SAB IAB Ectopic Multiple Live Births  0 0 0 0 2    # Outcome Date GA Lbr Len/2nd Weight Sex Type Anes PTL Lv  2 Term 10/03/11 [redacted]w[redacted]d   M CS-LTranv Spinal  LIV     Birth Comments: No dysmorphic features, voided x 1  1 Term 2001    M CS-Unspec   LIV     Birth Comments: failure to progress    Past Medical History:  Diagnosis Date   Anemia    Hypertension    Hyperthyroidism    with pregnancy   IBS (irritable bowel syndrome)     Past Surgical History:  Procedure Laterality Date   CESAREAN SECTION     CHOLECYSTECTOMY N/A 06/01/2022   Procedure: LAPAROSCOPIC CHOLECYSTECTOMY WITH INTRAOPERATIVE CHOLANGIOGRAM;  Surgeon:  Shela Derby, MD;  Location: [facility] OR;  Service: General;  Laterality: N/A;   TONSILLECTOMY     as child   TUBAL LIGATION      Current Outpatient Medications on File Prior to Visit  Medication Sig Dispense Refill   cyclobenzaprine (FLEXERIL) 10 MG tablet Take 1 tablet (10 mg total) by mouth 3 (three) times daily as needed for muscle spasms. 60 tablet 2   furosemide (LASIX) 20 MG tablet Take 1 tablet (20 mg total) by mouth daily. 90 tablet 3   ibuprofen (ADVIL,MOTRIN) 200 MG tablet Take 800 mg by mouth every 8 (eight) hours as needed. For pain     Multiple Vitamins-Minerals (MULTIVITAMIN WITH MINERALS) tablet Take 1 tablet by mouth daily.     norethindrone (AYGESTIN) 5 MG tablet Take 5 mg by mouth daily.     polyethylene glycol (MIRALAX / GLYCOLAX) packet Take 17 g by mouth daily as needed for mild constipation.     terbinafine (LAMISIL) 250 MG tablet Take 1 tablet (250 mg total) by mouth daily. 90 tablet 1   traMADol (ULTRAM) 50 MG tablet Take 1 tablet (50 mg total) by mouth every 6 (six) hours as needed for severe pain. 15 tablet 0   [DISCONTINUED] ferrous sulfate 300 (60 FE) MG/5ML syrup Take 300 mg by mouth daily.       No current facility-administered medications on file prior to visit.  No Known Allergies  Social History:  reports that she has been smoking cigarettes. She has never used smokeless tobacco. She reports that she does not drink alcohol and does not use drugs.  Family History  Problem Relation Age of Onset   Diabetes Unknown     The following portions of the patient's history were reviewed and updated as appropriate: allergies, current medications, past family history, past medical history, past social history, past surgical history and problem list.  Review of Systems ROS: Pertinent findings noted in HPI.   Physical Exam:  BP (!) 155/93   Pulse 76   Ht 5\' 2"  (1.575 m)   Wt 201 lb (91.2 kg)   LMP 09/30/2023 (Exact Date)   BMI 36.76 kg/m  CONSTITUTIONAL:  Well-developed, well-nourished female in no acute distress.  HENT:  Normocephalic, atraumatic. Oropharynx is clear and moist EYES: Conjunctivae normal. No scleral icterus.  SKIN: Skin is warm and dry. No rash noted. Not diaphoretic. No erythema. No pallor. MUSCULOSKELETAL: Normal range of motion. No tenderness.  No cyanosis or edema.   NEUROLOGIC: Alert and oriented to person, place, and time. Normal muscle tone coordination.  PSYCHIATRIC: Normal mood and affect. Normal behavior. Normal judgment and thought content. CARDIOVASCULAR: Normal heart rate noted. RESPIRATORY: Effort and rate normal. BREASTS: Declined ABDOMEN: Soft, no distention, tenderness, rebound or guarding.  PELVIC: Normal appearing external genitalia; normal appearing vaginal mucosa and cervix.  No abnormal discharge noted.  Pap smear obtained.   Assessment and Plan:   1. Cervical cancer screening - Cytology - PAP( [place])  2. Breast cancer screening by mammogram (Primary) - MM Digital Screening; Future  3. Abnormal uterine bleeding (AUB) - US  PELVIC COMPLETE WITH TRANSVAGINAL; Future   Assessment and Plan    Heavy Menstrual Bleeding Bleeding improved with Aygestin but remains irregular. Possible causes include fibroids, thickened endometrial lining, or polyps. Pelvic ultrasound recommended to evaluate uterine lining and check for abnormalities. Consider endometrial biopsy if abnormalities are found. Alternatives include Mirena IUD or endometrial ablation. Hysterectomy is last resort. - Order pelvic ultrasound. - Consider endometrial biopsy if ultrasound shows abnormalities. - Discuss potential use of Mirena IUD or endometrial ablation for management of bleeding if needed.  Nipple Discharge Recent onset of nipple discharge and itching. Prolactin levels to be checked for hormonal imbalances. - Check prolactin level  General Health Maintenance Due for mammogram. History of normal Pap smears. No family history  of breast cancer, low risk. - Schedule mammogram. - Perform Pap smear during visit.     Chronic Hypertension - Strongly encouraged to restart Lasix and F/u w/ PCP   Will follow up results of pap smear and manage accordingly. Mammogram scheduled Routine preventative health maintenance measures emphasized. Please refer to After Visit Summary for other counseling recommendations.      Mirren Gest  Felipe Horton, CNM [facility] [employer], [facility]

## 2023-10-11 LAB — PROLACTIN: Prolactin: 6 ng/mL (ref 4.8–33.4)

## 2023-10-12 DIAGNOSIS — N939 Abnormal uterine and vaginal bleeding, unspecified: Secondary | ICD-10-CM | POA: Insufficient documentation

## 2023-10-17 ENCOUNTER — Encounter: Payer: Self-pay | Admitting: Advanced Practice Midwife

## 2023-10-17 ENCOUNTER — Ambulatory Visit (HOSPITAL_COMMUNITY): Attending: Advanced Practice Midwife

## 2023-10-17 LAB — CYTOLOGY - PAP
Adequacy: ABSENT
Comment: NEGATIVE
Diagnosis: NEGATIVE
High risk HPV: NEGATIVE

## 2023-10-24 ENCOUNTER — Ambulatory Visit
Admission: RE | Admit: 2023-10-24 | Discharge: 2023-10-24 | Disposition: A | Discharge status: Home / Self Care | Source: Ambulatory Visit | Attending: Advanced Practice Midwife | Admitting: Advanced Practice Midwife

## 2023-10-24 DIAGNOSIS — Z1231 Encounter for screening mammogram for malignant neoplasm of breast: Secondary | ICD-10-CM

## 2023-11-08 ENCOUNTER — Ambulatory Visit (HOSPITAL_BASED_OUTPATIENT_CLINIC_OR_DEPARTMENT_OTHER)
Admission: RE | Admit: 2023-11-08 | Discharge: 2023-11-08 | Disposition: A | Discharge status: Home / Self Care | Source: Ambulatory Visit | Attending: Internal Medicine | Admitting: Internal Medicine

## 2023-11-08 ENCOUNTER — Ambulatory Visit (INDEPENDENT_AMBULATORY_CARE_PROVIDER_SITE_OTHER): Admitting: Internal Medicine

## 2023-11-08 ENCOUNTER — Encounter: Payer: Self-pay | Admitting: Internal Medicine

## 2023-11-08 ENCOUNTER — Ambulatory Visit: Payer: Self-pay

## 2023-11-08 VITALS — BP 126/72 | HR 90 | Temp 97.6°F | Resp 16 | Ht 62.0 in | Wt 201.1 lb

## 2023-11-08 DIAGNOSIS — S99912A Unspecified injury of left ankle, initial encounter: Secondary | ICD-10-CM

## 2023-11-08 NOTE — Telephone Encounter (Signed)
 Noted.

## 2023-11-08 NOTE — Patient Instructions (Signed)
 Get x-ray at the first floor  Try to rest is much as you can the next few days  Ice the area twice daily  Ace wrap your ankle  Keep the leg elevated when you are at home to decrease the swelling  IBUPROFEN  (Advil  or Motrin ) 200 mg 2 tablets every 8 hours as needed for pain.  Always take it with food because may cause gastritis and ulcers.  If you notice nausea, stomach pain, change in the color of stools --->  Stop the medicine and let us  know  If you are not gradually better in the next few days let us  know

## 2023-11-08 NOTE — Telephone Encounter (Signed)
 Copied from CRM [id number]. Topic: Clinical - Red Word Triage >> Nov 08, 2023  1:14 PM Rosheka N wrote: [facility] that prompted transfer to Nurse Triage: injury to left ankle and she cannot walk on it and she is wanting to see someone for it   Chief Complaint: Left ankle pain, swelling. Symptoms: Above Frequency: Sunday Pertinent Negatives: Patient denies any injury Disposition: [] ED /[] Urgent Care (no appt availability in office) / [] Appointment(In office/virtual)/ []  [facility] [facility]/ [] Home Care/ [] Refused Recommended Disposition /[] [place] [facility]/ [x]  Follow-up with PCP Additional Notes: Warm transfer to [facility] in the practice for appointment.  Reason for Disposition  [1] SEVERE pain (e.g., excruciating, unable to walk) AND [2] not improved after 2 hours of pain medicine  Answer Assessment - Initial Assessment Questions 1. ONSET: "When did the pain start?"      Sunday 2. LOCATION: "Where is the pain located?"      Left ankle 3. PAIN: "How bad is the pain?"    (Scale 1-10; or mild, moderate, severe)  - MILD (1-3): doesn't interfere with normal activities.   - MODERATE (4-7): interferes with normal activities (e.g., work or school) or awakens from sleep, limping.   - SEVERE (8-10): excruciating pain, unable to do any normal activities, unable to walk.      Severe 4. WORK OR EXERCISE: "Has there been any recent work or exercise that involved this part of the body?"      no 5. CAUSE: "What do you think is causing the ankle pain?"     Unsure 6. OTHER SYMPTOMS: "Do you have any other symptoms?" (e.g., calf pain, rash, fever, swelling)     no 7. PREGNANCY: "Is there any chance you are pregnant?" "When was your last menstrual period?"     no  Protocols used: Ankle Pain-A-AH

## 2023-11-08 NOTE — Progress Notes (Unsigned)
   Subjective:    Patient ID: Shelby Harris, female    DOB: April 22, 1976, 48 y.o.   MRN: [id number]  DOS:  11/08/2023 Type of visit - description: Acute  Last weekend was very busy for her, worked long hours walking and standing up.    About 5 days ago developed a very mild pain on the left ankle. Today, she was getting down from a van on her left foot landed flat, did not twisted but immediately developed pain on the inner side of the foot. Mild swelling noted. Walking makes the pain worse.   Review of Systems See above   Past Medical History:  Diagnosis Date   Anemia    Hypertension    Hyperthyroidism    with pregnancy   IBS (irritable bowel syndrome)     Past Surgical History:  Procedure Laterality Date   CESAREAN SECTION     CHOLECYSTECTOMY N/A 06/01/2022   Procedure: LAPAROSCOPIC CHOLECYSTECTOMY WITH INTRAOPERATIVE CHOLANGIOGRAM;  Surgeon: Shela Derby, MD;  Location: [facility] OR;  Service: General;  Laterality: N/A;   TONSILLECTOMY     as child   TUBAL LIGATION      Current Outpatient Medications  Medication Instructions   cyclobenzaprine  (FLEXERIL ) 10 mg, Oral, 3 times daily PRN   furosemide  (LASIX ) 20 mg, Oral, Daily   ibuprofen  (ADVIL ) 800 mg, Every 8 hours PRN   Multiple Vitamins-Minerals (MULTIVITAMIN WITH MINERALS) tablet 1 tablet, Daily   norethindrone (AYGESTIN) 5 mg, Daily   polyethylene glycol (MIRALAX / GLYCOLAX) 17 g, Daily PRN   terbinafine  (LAMISIL ) 250 mg, Oral, Daily   traMADol  (ULTRAM ) 50 mg, Oral, Every 6 hours PRN       Objective:   Physical Exam Musculoskeletal:       Feet:    BP 126/72   Pulse 90   Temp 97.6 F (36.4 C) (Oral)   Resp 16   Ht 5\' 2"  (1.575 m)   Wt 201 lb 2 oz (91.2 kg)   LMP 09/30/2023 (Exact Date)   SpO2 96%   BMI 36.79 kg/m  General:   Well developed, NAD, BMI noted. HEENT:  Normocephalic . Face symmetric, atraumatic Feet: R: Normal  L left: Very subtle peri-ankle swelling.  Good pedal pulses.  Ankle ROM  normal.  No TTP at the dorsum or arch.  See graphic Skin: Not pale. Not jaundice Neurologic:  alert & oriented X3.  Speech normal, gait appropriate for age and unassisted Psych--  Cognition and judgment appear intact.  Cooperative with normal attention span and concentration.  Behavior appropriate. No anxious or depressed appearing.      Assessment     48 year old female, h/o BTL, presents with:  Left ankle pain: Pain located slightly distal from the left lateral malleolus.  Minimal swelling noted.  Symptoms start a few days ago, much worse after she is stepped out from a van. Suspect ankle sprain: X-ray, rest, ice, Ace wrap, judicious use of ibuprofen  with GI precautions. All this was discussed with the patient, she verbalized understanding, if not better refer to sports medicine or Ortho

## 2023-11-14 ENCOUNTER — Other Ambulatory Visit: Payer: Self-pay

## 2023-11-14 ENCOUNTER — Encounter: Payer: Self-pay | Admitting: Family Medicine

## 2023-11-14 ENCOUNTER — Ambulatory Visit (HOSPITAL_COMMUNITY)
Admission: RE | Admit: 2023-11-14 | Discharge: 2023-11-14 | Disposition: A | Discharge status: Home / Self Care | Source: Ambulatory Visit | Attending: Advanced Practice Midwife | Admitting: Advanced Practice Midwife

## 2023-11-14 ENCOUNTER — Ambulatory Visit: Admitting: Obstetrics and Gynecology

## 2023-11-14 VITALS — BP 146/92 | HR 106 | Wt 198.6 lb

## 2023-11-14 DIAGNOSIS — N939 Abnormal uterine and vaginal bleeding, unspecified: Secondary | ICD-10-CM

## 2023-11-14 NOTE — Progress Notes (Signed)
 GYNECOLOGY VISIT  Patient name: Shelby Harris MRN [id number]  Date of birth: [birth year] Chief Complaint:   Menstrual Problem  History:  Shelby Harris is a 48 y.o. G2P2002 being seen today for discussion of menstrual management.    Last seen 10/10/23: seen fo rannual gyn and noted to have fair control of her menses with aygestin, remains irregular. Discussed possible EMB, IUD vs surgical management pending US  results.   Discussed the use of AI scribe software for clinical note transcription with the patient, who gave verbal consent to proceed.  History of Present Illness Shelby Harris is a 48 year old female who presents for discussion of an ultrasound and concerns about menopause.  She experiences irregular bleeding while on Aygestin, which has improved her symptoms compared to before starting the medication. Previously, she had uncontrollable bleeding with clots. Currently, her cycles are monthly but lighter.  Aygestin was prescribed by a provider named Chin at a different location. No ultrasound or biopsy was performed before starting the medication. She had emergency surgery and did not return to the previous provider.  She is concerned about potential fibroids and a tilted uterus. She experiences frequent stomach pain and is interested in understanding if fibroids are present and whether a hysterectomy is necessary.  She has a family history of fibroids, with her sister having undergone a hysterectomy at age 33 due to complications. She wants to avoid prolonged bleeding and potential future complications by considering a hysterectomy now.    Past Medical History:  Diagnosis Date   Anemia    Hypertension    Hyperthyroidism    with pregnancy   IBS (irritable bowel syndrome)     Past Surgical History:  Procedure Laterality Date   CESAREAN SECTION     CHOLECYSTECTOMY N/A 06/01/2022   Procedure: LAPAROSCOPIC CHOLECYSTECTOMY WITH INTRAOPERATIVE CHOLANGIOGRAM;  Surgeon:  Shela Derby, MD;  Location: [facility] OR;  Service: General;  Laterality: N/A;   TONSILLECTOMY     as child   TUBAL LIGATION      The following portions of the patient's history were reviewed and updated as appropriate: allergies, current medications, past family history, past medical history, past social history, past surgical history and problem list.   Health Maintenance:   Last pap     Component Value Date/Time   DIAGPAP  10/10/2023 1651    - Negative for intraepithelial lesion or malignancy (NILM)   HPVHIGH Negative 10/10/2023 1651   ADEQPAP  10/10/2023 1651    Satisfactory for evaluation; transformation zone component ABSENT.    Last mammogram: 10/2023 BIRADS 1   Review of Systems:  Pertinent items are noted in HPI. Comprehensive review of systems was otherwise negative.   Objective:  Physical Exam BP (!) 146/92   Pulse (!) 106   Wt 198 lb 9.6 oz (90.1 kg)   LMP 11/14/2023 (Exact Date)   BMI 36.32 kg/m    Physical Exam Vitals and nursing note reviewed.  Constitutional:      Appearance: Normal appearance.  HENT:     Head: Normocephalic and atraumatic.  Pulmonary:     Effort: Pulmonary effort is normal.  Skin:    General: Skin is warm and dry.  Neurological:     General: No focal deficit present.     Mental Status: She is alert.  Psychiatric:        Mood and Affect: Mood normal.        Behavior: Behavior normal.  Thought Content: Thought content normal.        Judgment: Judgment normal.       Assessment & Plan:   Assessment & Plan Irregular menstrual bleeding Bleeding controlled with Aygestin. Discussed potential fibroids and family history. Hysterectomy not necessary as bleeding is managed. Explained hysterectomy is not preventative and discussed associated risks. - Continue Aygestin. - Order pelvic ultrasound for fibroid evaluation. - Plan endometrial biopsy to rule out polyps, precancer, or cancer.   Routine preventative health maintenance  measures emphasized.  Kiki Pelton, MD [facility] for [facility], [facility]

## 2023-11-27 ENCOUNTER — Ambulatory Visit: Payer: Self-pay | Admitting: Advanced Practice Midwife

## 2023-12-25 ENCOUNTER — Other Ambulatory Visit: Payer: Self-pay

## 2023-12-25 ENCOUNTER — Other Ambulatory Visit (HOSPITAL_COMMUNITY)
Admission: RE | Admit: 2023-12-25 | Discharge: 2023-12-25 | Disposition: A | Discharge status: Home / Self Care | Source: Ambulatory Visit | Attending: Obstetrics and Gynecology | Admitting: Obstetrics and Gynecology

## 2023-12-25 ENCOUNTER — Encounter: Payer: Self-pay | Admitting: Obstetrics and Gynecology

## 2023-12-25 ENCOUNTER — Ambulatory Visit (INDEPENDENT_AMBULATORY_CARE_PROVIDER_SITE_OTHER): Admitting: Obstetrics and Gynecology

## 2023-12-25 VITALS — BP 160/93 | HR 86 | Wt 200.9 lb

## 2023-12-25 DIAGNOSIS — N939 Abnormal uterine and vaginal bleeding, unspecified: Secondary | ICD-10-CM | POA: Diagnosis present

## 2023-12-25 DIAGNOSIS — Z1331 Encounter for screening for depression: Secondary | ICD-10-CM | POA: Diagnosis not present

## 2023-12-25 NOTE — Progress Notes (Signed)
 GYNECOLOGY VISIT  Patient name: Shelby Harris MRN [id number]  Date of birth: 18-Apr-1976 Chief Complaint:   Procedure   History:  Shelby Harris is a 48 y.o. G2P2002 being seen today for follow up of AUB and US . Still doing ok with the aygestin - having normal cycles when it arrives, very light compared to what it had been before and far more manageable than it had before.  The cycles are shorter than they have been bfore. Feels like what it should be. Does not want IUD in place. Also notes having a lot of health issues occurring concurrently and would like to get as much as done as possible before school resumes at the end of August. Would like to be sure that her health is intact and malignancy is ruled out.   Past Medical History:  Diagnosis Date   Anemia    Hypertension    Hyperthyroidism    with pregnancy   IBS (irritable bowel syndrome)     Past Surgical History:  Procedure Laterality Date   CESAREAN SECTION     CHOLECYSTECTOMY N/A 06/01/2022   Procedure: LAPAROSCOPIC CHOLECYSTECTOMY WITH INTRAOPERATIVE CHOLANGIOGRAM;  Surgeon: Rubin Calamity, MD;  Location: [facility] OR;  Service: General;  Laterality: N/A;   TONSILLECTOMY     as child   TUBAL LIGATION      The following portions of the patient's history were reviewed and updated as appropriate: allergies, current medications, past family history, past medical history, past social history, past surgical history and problem list.   Health Maintenance:   Last pap     Component Value Date/Time   DIAGPAP  10/10/2023 1651    - Negative for intraepithelial lesion or malignancy (NILM)   HPVHIGH Negative 10/10/2023 1651   ADEQPAP  10/10/2023 1651    Satisfactory for evaluation; transformation zone component ABSENT.     Last mammogram: BIRADS 1 10/2023   Review of Systems:  Pertinent items are noted in HPI. Comprehensive review of systems was otherwise negative.   Objective:  Physical Exam BP (!) 158/102   Pulse  88   Wt 200 lb 14.4 oz (91.1 kg)   LMP 12/19/2023 (Approximate)   BMI 36.75 kg/m    Physical Exam Vitals and nursing note reviewed.  Constitutional:      Appearance: Normal appearance.  HENT:     Head: Normocephalic and atraumatic.  Pulmonary:     Effort: Pulmonary effort is normal.   Skin:    General: Skin is warm and dry.   Neurological:     General: No focal deficit present.     Mental Status: She is alert.   Psychiatric:        Mood and Affect: Mood normal.        Behavior: Behavior normal.        Thought Content: Thought content normal.        Judgment: Judgment normal.      Labs and Imaging Pelvic US   IMPRESSION: Endometrium measures 10 mm. If bleeding remains unresponsive to hormonal or medical therapy, sonohysterogram should be considered for focal lesion work-up. (Ref: Radiological Reasoning: Algorithmic Workup of Abnormal Vaginal Bleeding with Endovaginal Sonography and Sonohysterography. AJR 2008; 808:D31-26)   Endometrial Biopsy Procedure  Patient identified, informed consent performed,  indication reviewed, consent signed.  Reviewed risk of perforation, pain, bleeding, insufficient sample, etc were reviewd. Time out was performed.  Urine pregnancy test negative.  Speculum placed in the vagina.  Cervix visualized.  Cleaned with Betadine  x 2.  Paracervical block was not administered.  Endometrial pipelle was used to draw up 1cc of 1% lidocaine  and initially was not able to be passed through the os. The cervix was sprayed with hurricane spray and the cervix grasped with a tenaculum. The cervix was gently dilated with disposable dilators. The pipelle was then introduced into the cervical os and instilled into the endometrial cavity.  The pipelle was passed twice without difficulty and sample obtained. Tenaculum was removed, good hemostasis noted.  Patient tolerated procedure well.  Patient was given post-procedure instructions.      Assessment & Plan:   1.  Abnormal uterine bleeding (AUB) (Primary) Now s/p uncomplicated EMB. Will continue aygestin for now. Will follow up surgical pathology and submit surgical scheduling request for endometrial ablation. Will further review risks, benefits, alternatives when reviewing results of pathology.   - Surgical pathology - Ambulatory Referral For Surgery Scheduling   Routine preventative health maintenance measures emphasized.  Carter Quarry, MD Minimally Invasive Gynecologic [facility] for [facility], [facility]

## 2023-12-27 LAB — SURGICAL PATHOLOGY

## 2023-12-30 ENCOUNTER — Ambulatory Visit: Payer: Self-pay | Admitting: Obstetrics and Gynecology

## 2024-01-30 ENCOUNTER — Other Ambulatory Visit: Payer: Self-pay | Admitting: Family Medicine

## 2024-01-30 ENCOUNTER — Telehealth: Payer: Self-pay | Admitting: Family Medicine

## 2024-01-30 DIAGNOSIS — Z0279 Encounter for issue of other medical certificate: Secondary | ICD-10-CM

## 2024-01-30 NOTE — Telephone Encounter (Signed)
Pt form received and placed on Dr Fry red folder 

## 2024-01-30 NOTE — Telephone Encounter (Signed)
 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 Patient requested to send it back via Call Patient to pick up within 7-days. Document is located in providers tray at front office.Please advise at Mobile [phone] (mobile)

## 2024-01-31 NOTE — Telephone Encounter (Signed)
 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 Form placed in the front office filing cabinet. Pt will pick up form this afternoon.

## 2024-02-10 ENCOUNTER — Ambulatory Visit (INDEPENDENT_AMBULATORY_CARE_PROVIDER_SITE_OTHER): Admitting: Family Medicine

## 2024-02-10 ENCOUNTER — Encounter: Payer: Self-pay | Admitting: Family Medicine

## 2024-02-10 VITALS — BP 138/86 | HR 89 | Temp 98.3°F | Ht 62.0 in | Wt 200.0 lb

## 2024-02-10 DIAGNOSIS — Z0000 Encounter for general adult medical examination without abnormal findings: Secondary | ICD-10-CM

## 2024-02-10 DIAGNOSIS — Z23 Encounter for immunization: Secondary | ICD-10-CM

## 2024-02-10 LAB — BASIC METABOLIC PANEL WITH GFR
BUN: 7 mg/dL (ref 6–23)
CO2: 29 meq/L (ref 19–32)
Calcium: 9.4 mg/dL (ref 8.4–10.5)
Chloride: 104 meq/L (ref 96–112)
Creatinine, Ser: 0.79 mg/dL (ref 0.40–1.20)
GFR: 90 mL/min (ref >60.00)
Glucose, Bld: 91 mg/dL (ref 70–99)
Potassium: 4.6 meq/L (ref 3.5–5.1)
Sodium: 141 meq/L (ref 135–145)

## 2024-02-10 LAB — CBC WITH DIFFERENTIAL/PLATELET
Basophils Absolute: 0 K/uL (ref 0.0–0.1)
Basophils Relative: 0.8 % (ref 0.0–3.0)
Eosinophils Absolute: 0.1 K/uL (ref 0.0–0.7)
Eosinophils Relative: 2.6 % (ref 0.0–5.0)
HCT: 38.9 % (ref 36.0–46.0)
Hemoglobin: 12.4 g/dL (ref 12.0–15.0)
Lymphocytes Relative: 21.7 % (ref 12.0–46.0)
Lymphs Abs: 1.1 K/uL (ref 0.7–4.0)
MCHC: 32 g/dL (ref 30.0–36.0)
MCV: 86 fl (ref 78.0–100.0)
Monocytes Absolute: 0.5 K/uL (ref 0.1–1.0)
Monocytes Relative: 9 % (ref 3.0–12.0)
Neutro Abs: 3.4 K/uL (ref 1.4–7.7)
Neutrophils Relative %: 65.9 % (ref 43.0–77.0)
Platelets: 379 K/uL (ref 150.0–400.0)
RBC: 4.52 Mil/uL (ref 3.87–5.11)
RDW: 14.1 % (ref 11.5–15.5)
WBC: 5.1 K/uL (ref 4.0–10.5)

## 2024-02-10 LAB — LIPID PANEL
Cholesterol: 101 mg/dL (ref 0–200)
HDL: 49.6 mg/dL (ref >39.00)
LDL Cholesterol: 40 mg/dL (ref 0–99)
NonHDL: 51.83
Total CHOL/HDL Ratio: 2
Triglycerides: 59 mg/dL (ref 0.0–149.0)
VLDL: 11.8 mg/dL (ref 0.0–40.0)

## 2024-02-10 LAB — HEPATIC FUNCTION PANEL
ALT: 9 U/L (ref 0–35)
AST: 15 U/L (ref 0–37)
Albumin: 4.2 g/dL (ref 3.5–5.2)
Alkaline Phosphatase: 84 U/L (ref 39–117)
Bilirubin, Direct: 0.1 mg/dL (ref 0.0–0.3)
Total Bilirubin: 0.5 mg/dL (ref 0.2–1.2)
Total Protein: 7.6 g/dL (ref 6.0–8.3)

## 2024-02-10 LAB — TSH: TSH: 0.9 u[IU]/mL (ref 0.35–5.50)

## 2024-02-10 LAB — HEMOGLOBIN A1C: Hgb A1c MFr Bld: 5.3 % (ref 4.6–6.5)

## 2024-02-10 MED ORDER — FUROSEMIDE 20 MG PO TABS: 20.0000 mg | ORAL_TABLET | Freq: Every day | ORAL | 3 refills | Status: AC

## 2024-02-10 MED ORDER — CYCLOBENZAPRINE HCL 10 MG PO TABS: 10.0000 mg | ORAL_TABLET | Freq: Three times a day (TID) | ORAL | 5 refills | Status: AC | PRN

## 2024-02-10 NOTE — Progress Notes (Signed)
   Subjective:    Patient ID: Shelby Harris Counter, female    DOB: 08/19/1975, 48 y.o.   MRN: [id number]  HPI Here for a well exam. She feels well except for occasional low back pains. She sees her GYN yearly.    Review of Systems  Constitutional: Negative.   HENT: Negative.    Eyes: Negative.   Respiratory: Negative.    Cardiovascular: Negative.   Gastrointestinal: Negative.   Genitourinary:  Negative for decreased urine volume, difficulty urinating, dyspareunia, dysuria, enuresis, flank pain, frequency, hematuria, pelvic pain and urgency.  Musculoskeletal:  Positive for back pain.  Skin: Negative.   Neurological: Negative.  Negative for headaches.  Psychiatric/Behavioral: Negative.         Objective:   Physical Exam Constitutional:      General: She is not in acute distress.    Appearance: She is well-developed.  HENT:     Head: Normocephalic and atraumatic.     Right Ear: External ear normal.     Left Ear: External ear normal.     Nose: Nose normal.     Mouth/Throat:     Pharynx: No oropharyngeal exudate.  Eyes:     General: No scleral icterus.    Conjunctiva/sclera: Conjunctivae normal.     Pupils: Pupils are equal, round, and reactive to light.  Neck:     Thyroid: No thyromegaly.     Vascular: No JVD.  Cardiovascular:     Rate and Rhythm: Normal rate and regular rhythm.     Pulses: Normal pulses.     Heart sounds: Normal heart sounds. No murmur heard.    No friction rub. No gallop.  Pulmonary:     Effort: Pulmonary effort is normal. No respiratory distress.     Breath sounds: Normal breath sounds. No wheezing or rales.  Chest:     Chest wall: No tenderness.  Abdominal:     General: Bowel sounds are normal. There is no distension.     Palpations: Abdomen is soft. There is no mass.     Tenderness: There is no abdominal tenderness. There is no guarding or rebound.  Musculoskeletal:        General: No tenderness. Normal range of motion.     Cervical back: Normal  range of motion and neck supple.  Lymphadenopathy:     Cervical: No cervical adenopathy.  Skin:    General: Skin is warm and dry.     Findings: No erythema or rash.  Neurological:     General: No focal deficit present.     Mental Status: She is alert and oriented to person, place, and time.     Cranial Nerves: No cranial nerve deficit.     Motor: No abnormal muscle tone.     Coordination: Coordination normal.     Deep Tendon Reflexes: Reflexes are normal and symmetric. Reflexes normal.  Psychiatric:        Mood and Affect: Mood normal.        Behavior: Behavior normal.        Thought Content: Thought content normal.        Judgment: Judgment normal.           Assessment & Plan:  Well exam. We discussed diet and exercise. Get fasting labs. Set up a colonoscopy.  Garnette Olmsted, MD

## 2024-02-11 ENCOUNTER — Ambulatory Visit: Payer: Self-pay | Admitting: Family Medicine

## 2024-02-21 ENCOUNTER — Telehealth: Payer: Self-pay

## 2024-02-21 NOTE — Telephone Encounter (Signed)
 I called patient to schedule surgery w/ Dr. Jeralyn. Patient chose 04/06/24 at [facility] at 11 am. Pre-op instructions and surgery details were provided by phone.

## 2024-04-01 ENCOUNTER — Encounter: Payer: Self-pay | Admitting: Internal Medicine

## 2024-04-01 ENCOUNTER — Ambulatory Visit

## 2024-04-01 VITALS — Ht 62.0 in | Wt 200.0 lb

## 2024-04-01 DIAGNOSIS — Z1211 Encounter for screening for malignant neoplasm of colon: Secondary | ICD-10-CM

## 2024-04-01 MED ORDER — NA SULFATE-K SULFATE-MG SULF 17.5-3.13-1.6 GM/177ML PO SOLN: 1.0000 | Freq: Once | ORAL | 0 refills | Status: AC

## 2024-04-01 NOTE — Progress Notes (Signed)
 No egg or soy allergy known to patient  No issues known to pt with past sedation with any surgeries or procedures Patient denies ever being told they had issues or difficulty with intubation  No FH of Malignant Hyperthermia Pt is not on diet pills Pt is not on  home 02  Pt is not on blood thinners  Pt has issues with constipation , BM every other day, extra Miralax for prep No A fib or A flutter Have any cardiac testing pending--No Pt can ambulate  Pt denies use of chewing tobacco Discussed diabetic I weight loss medication holds Discussed NSAID holds Checked BMI Pt instructed to use Singlecare.com or GoodRx for a price reduction on prep  Patient's chart reviewed by Norleen Schillings CNRA prior to previsit and patient appropriate for the [facility].  Pre visit completed and red dot placed by patient's name on their procedure day (on provider's schedule).

## 2024-04-06 ENCOUNTER — Ambulatory Visit (HOSPITAL_COMMUNITY): Admit: 2024-04-06 | Admitting: Obstetrics and Gynecology

## 2024-04-06 SURGERY — DILATATION & CURETTAGE/HYSTEROSCOPY WITH RESECTOCOPE
Anesthesia: Choice

## 2024-04-17 ENCOUNTER — Encounter: Payer: Self-pay | Admitting: Internal Medicine

## 2024-04-17 ENCOUNTER — Ambulatory Visit (AMBULATORY_SURGERY_CENTER): Admitting: Internal Medicine

## 2024-04-17 VITALS — BP 139/82 | HR 84 | Temp 97.1°F | Resp 13 | Ht 62.0 in | Wt 200.0 lb

## 2024-04-17 DIAGNOSIS — K5730 Diverticulosis of large intestine without perforation or abscess without bleeding: Secondary | ICD-10-CM | POA: Diagnosis not present

## 2024-04-17 DIAGNOSIS — Z1211 Encounter for screening for malignant neoplasm of colon: Secondary | ICD-10-CM

## 2024-04-17 MED ORDER — SODIUM CHLORIDE 0.9 % IV SOLN: 500.0000 mL | INTRAVENOUS | Status: AC

## 2024-04-17 NOTE — Patient Instructions (Signed)
Discharge instructions given. Handout on Diverticulosis. Resume previous medications. YOU HAD AN ENDOSCOPIC PROCEDURE TODAY AT THE [facility]:   Refer to the procedure report that was given to you for any specific questions about what was found during the examination.  If the procedure report does not answer your questions, please call your gastroenterologist to clarify.  If you requested that your care partner not be given the details of your procedure findings, then the procedure report has been included in a sealed envelope for you to review at your convenience later.  YOU SHOULD EXPECT: Some feelings of bloating in the abdomen. Passage of more gas than usual.  Walking can help get rid of the air that was put into your GI tract during the procedure and reduce the bloating. If you had a lower endoscopy (such as a colonoscopy or flexible sigmoidoscopy) you may notice spotting of blood in your stool or on the toilet paper. If you underwent a bowel prep for your procedure, you may not have a normal bowel movement for a few days.  Please Note:  You might notice some irritation and congestion in your nose or some drainage.  This is from the oxygen used during your procedure.  There is no need for concern and it should clear up in a day or so.  SYMPTOMS TO REPORT IMMEDIATELY:  Following lower endoscopy (colonoscopy or flexible sigmoidoscopy):  Excessive amounts of blood in the stool  Significant tenderness or worsening of abdominal pains  Swelling of the abdomen that is new, acute  Fever of 100F or higher   For urgent or emergent issues, a gastroenterologist can be reached at any hour by calling [phone]. Do not use MyChart messaging for urgent concerns.    DIET:  We do recommend a small meal at first, but then you may proceed to your regular diet.  Drink plenty of fluids but you should avoid alcoholic beverages for 24 hours.  ACTIVITY:  You should plan to take it easy for  the rest of today and you should NOT DRIVE or use heavy machinery until tomorrow (because of the sedation medicines used during the test).    FOLLOW UP: Our staff will call the number listed on your records the next business day following your procedure.  We will call around 7:15- 8:00 am to check on you and address any questions or concerns that you may have regarding the information given to you following your procedure. If we do not reach you, we will leave a message.     If any biopsies were taken you will be contacted by phone or by letter within the next 1-3 weeks.  Please call us at [phone] if you have not heard about the biopsies in 3 weeks.    SIGNATURES/CONFIDENTIALITY: You and/or your care partner have signed paperwork which will be entered into your electronic medical record.  These signatures attest to the fact that that the information above on your After Visit Summary has been reviewed and is understood.  Full responsibility of the confidentiality of this discharge information lies with you and/or your care-partner.  

## 2024-04-17 NOTE — Progress Notes (Signed)
 Vss nad trans to pacu

## 2024-04-17 NOTE — Op Note (Signed)
 [facility] Patient Name: Shelby Harris Procedure Date: 04/17/2024 2:40 PM MRN: [id number] Endoscopist: Norleen SAILOR. Abran , MD, [id number] Age: 48 Referring MD:  Date of Birth: [birth year] Gender: Female Account #: [id number] Procedure:                Colonoscopy Indications:              Screening for colorectal malignant neoplasm Medicines:                Monitored Anesthesia Care Procedure:                Pre-Anesthesia Assessment:                           - Prior to the procedure, a History and Physical                            was performed, and patient medications and                            allergies were reviewed. The patient's tolerance of                            previous anesthesia was also reviewed. The risks                            and benefits of the procedure and the sedation                            options and risks were discussed with the patient.                            All questions were answered, and informed consent                            was obtained. Prior Anticoagulants: The patient has                            taken no anticoagulant or antiplatelet agents. ASA                            Grade Assessment: II - A patient with mild systemic                            disease. After reviewing the risks and benefits,                            the patient was deemed in satisfactory condition to                            undergo the procedure.                           After obtaining informed consent, the colonoscope  was passed under direct vision. Throughout the                            procedure, the patient's blood pressure, pulse, and                            oxygen saturations were monitored continuously. The                            Olympus CF-HQ190L ([id number]) Colonoscope was                            introduced through the anus and advanced to the the                            cecum,  identified by appendiceal orifice and                            ileocecal valve. The ileocecal valve, appendiceal                            orifice, and rectum were photographed. The quality                            of the bowel preparation was good. The colonoscopy                            was performed without difficulty. The patient                            tolerated the procedure well. The bowel preparation                            used was SUPREP via split dose instruction. Scope In: 2:50:01 PM Scope Out: 3:03:43 PM Scope Withdrawal Time: 0 hours 7 minutes 19 seconds  Total Procedure Duration: 0 hours 13 minutes 42 seconds  Findings:                 Multiple diverticula were found in the sigmoid                            colon.                           The exam was otherwise without abnormality on                            direct and retroflexion views. Complications:            No immediate complications. Estimated blood loss:                            None. Estimated Blood Loss:     Estimated blood loss: none. Impression:               - Diverticulosis in the sigmoid  colon.                           - The examination was otherwise normal on direct                            and retroflexion views.                           - No specimens collected. Recommendation:           - Repeat colonoscopy in 10 years for screening                            purposes.                           - Patient has a contact number available for                            emergencies. The signs and symptoms of potential                            delayed complications were discussed with the                            patient. Return to normal activities tomorrow.                            Written discharge instructions were provided to the                            patient.                           - Resume previous diet.                           - Continue present  medications. Norleen SAILOR. Abran, MD 04/17/2024 3:07:30 PM This report has been signed electronically.

## 2024-04-17 NOTE — Progress Notes (Signed)
 HISTORY OF PRESENT ILLNESS:  Shelby Harris is a 48 y.o. female sent directly for screening colonoscopy.  No complaints  REVIEW OF SYSTEMS:  All non-GI ROS negative except for  Past Medical History:  Diagnosis Date   Anemia    Hypertension    Hyperthyroidism    with pregnancy   IBS (irritable bowel syndrome)     Past Surgical History:  Procedure Laterality Date   CESAREAN SECTION     X2   CHOLECYSTECTOMY N/A 06/01/2022   Procedure: LAPAROSCOPIC CHOLECYSTECTOMY WITH INTRAOPERATIVE CHOLANGIOGRAM;  Surgeon: Rubin Calamity, MD;  Location: [facility] OR;  Service: General;  Laterality: N/A;   TONSILLECTOMY     as child   TUBAL LIGATION      Social History Shelby Harris  reports that she has been smoking cigarettes. She has never used smokeless tobacco. She reports that she does not drink alcohol and does not use drugs.  family history includes Diabetes in an other family member.  No Known Allergies     PHYSICAL EXAMINATION: Vital signs: BP (!) 154/90   Pulse 90   Temp (!) 97.1 F (36.2 C) (Temporal)   Ht 5' 2 (1.575 m)   Wt 200 lb (90.7 kg)   SpO2 99%   BMI 36.58 kg/m  General: Well-developed, well-nourished, no acute distress HEENT: Sclerae are anicteric, conjunctiva pink. Oral mucosa intact Lungs: Clear Heart: Regular Abdomen: soft, nontender, nondistended, no obvious ascites, no peritoneal signs, normal bowel sounds. No organomegaly. Extremities: No edema Psychiatric: alert and oriented x3. Cooperative     ASSESSMENT:  Colon cancer screening   PLAN:  Screening colonoscopy

## 2024-04-17 NOTE — Progress Notes (Signed)
 Pt's states no medical or surgical changes since previsit or office visit.

## 2024-04-20 ENCOUNTER — Telehealth: Payer: Self-pay

## 2024-04-20 NOTE — Telephone Encounter (Signed)
 Post procedure follow up call, no answer
